# Patient Record
Sex: Female | Born: 1935 | Race: White | Hispanic: No | Marital: Married | Smoking: Never smoker
Health system: Southern US, Community
[De-identification: ages and names within clinical notes are randomized; demographics above are authoritative.]

## PROBLEM LIST (undated history)

## (undated) DIAGNOSIS — I10 Essential (primary) hypertension: Secondary | ICD-10-CM

## (undated) HISTORY — PX: ABDOMINAL HYSTERECTOMY: SHX81

## (undated) HISTORY — PX: BREAST LUMPECTOMY: SHX2

## (undated) HISTORY — PX: CHOLECYSTECTOMY: SHX55

---

## 2014-07-16 DIAGNOSIS — H524 Presbyopia: Secondary | ICD-10-CM | POA: Diagnosis not present

## 2014-07-16 DIAGNOSIS — H25813 Combined forms of age-related cataract, bilateral: Secondary | ICD-10-CM | POA: Diagnosis not present

## 2014-07-18 DIAGNOSIS — R6889 Other general symptoms and signs: Secondary | ICD-10-CM | POA: Diagnosis not present

## 2014-09-14 DIAGNOSIS — Z79899 Other long term (current) drug therapy: Secondary | ICD-10-CM | POA: Diagnosis not present

## 2014-09-14 DIAGNOSIS — Z0000 Encounter for general adult medical examination without abnormal findings: Secondary | ICD-10-CM | POA: Diagnosis not present

## 2014-09-14 DIAGNOSIS — E538 Deficiency of other specified B group vitamins: Secondary | ICD-10-CM | POA: Diagnosis not present

## 2014-09-14 DIAGNOSIS — E780 Pure hypercholesterolemia: Secondary | ICD-10-CM | POA: Diagnosis not present

## 2014-09-14 DIAGNOSIS — E539 Vitamin B deficiency, unspecified: Secondary | ICD-10-CM | POA: Diagnosis not present

## 2014-10-07 DIAGNOSIS — K5730 Diverticulosis of large intestine without perforation or abscess without bleeding: Secondary | ICD-10-CM | POA: Diagnosis not present

## 2014-10-12 DIAGNOSIS — E539 Vitamin B deficiency, unspecified: Secondary | ICD-10-CM | POA: Diagnosis not present

## 2014-10-12 DIAGNOSIS — I10 Essential (primary) hypertension: Secondary | ICD-10-CM | POA: Diagnosis not present

## 2014-11-03 DIAGNOSIS — K5730 Diverticulosis of large intestine without perforation or abscess without bleeding: Secondary | ICD-10-CM | POA: Diagnosis not present

## 2014-11-03 DIAGNOSIS — Z9049 Acquired absence of other specified parts of digestive tract: Secondary | ICD-10-CM | POA: Diagnosis not present

## 2014-11-03 DIAGNOSIS — K648 Other hemorrhoids: Secondary | ICD-10-CM | POA: Diagnosis not present

## 2014-11-03 DIAGNOSIS — I10 Essential (primary) hypertension: Secondary | ICD-10-CM | POA: Diagnosis not present

## 2014-11-03 DIAGNOSIS — Z86010 Personal history of colonic polyps: Secondary | ICD-10-CM | POA: Diagnosis not present

## 2014-11-03 DIAGNOSIS — Z1211 Encounter for screening for malignant neoplasm of colon: Secondary | ICD-10-CM | POA: Diagnosis not present

## 2014-11-05 DIAGNOSIS — J209 Acute bronchitis, unspecified: Secondary | ICD-10-CM | POA: Diagnosis not present

## 2014-12-07 DIAGNOSIS — M25551 Pain in right hip: Secondary | ICD-10-CM | POA: Diagnosis not present

## 2014-12-19 DIAGNOSIS — M7071 Other bursitis of hip, right hip: Secondary | ICD-10-CM | POA: Diagnosis not present

## 2015-01-11 DIAGNOSIS — M7071 Other bursitis of hip, right hip: Secondary | ICD-10-CM | POA: Diagnosis not present

## 2015-02-04 DIAGNOSIS — Z1389 Encounter for screening for other disorder: Secondary | ICD-10-CM | POA: Diagnosis not present

## 2015-02-04 DIAGNOSIS — E780 Pure hypercholesterolemia: Secondary | ICD-10-CM | POA: Diagnosis not present

## 2015-02-04 DIAGNOSIS — Z0000 Encounter for general adult medical examination without abnormal findings: Secondary | ICD-10-CM | POA: Diagnosis not present

## 2015-02-04 DIAGNOSIS — H25813 Combined forms of age-related cataract, bilateral: Secondary | ICD-10-CM | POA: Diagnosis not present

## 2015-02-04 DIAGNOSIS — Z79899 Other long term (current) drug therapy: Secondary | ICD-10-CM | POA: Diagnosis not present

## 2015-02-04 DIAGNOSIS — Z9181 History of falling: Secondary | ICD-10-CM | POA: Diagnosis not present

## 2015-02-25 DIAGNOSIS — M7071 Other bursitis of hip, right hip: Secondary | ICD-10-CM | POA: Diagnosis not present

## 2015-03-03 DIAGNOSIS — M6281 Muscle weakness (generalized): Secondary | ICD-10-CM | POA: Diagnosis not present

## 2015-03-03 DIAGNOSIS — M2560 Stiffness of unspecified joint, not elsewhere classified: Secondary | ICD-10-CM | POA: Diagnosis not present

## 2015-03-03 DIAGNOSIS — M7071 Other bursitis of hip, right hip: Secondary | ICD-10-CM | POA: Diagnosis not present

## 2015-03-03 DIAGNOSIS — M25652 Stiffness of left hip, not elsewhere classified: Secondary | ICD-10-CM | POA: Diagnosis not present

## 2015-03-03 DIAGNOSIS — M79651 Pain in right thigh: Secondary | ICD-10-CM | POA: Diagnosis not present

## 2015-03-03 DIAGNOSIS — M25551 Pain in right hip: Secondary | ICD-10-CM | POA: Diagnosis not present

## 2015-03-03 DIAGNOSIS — R293 Abnormal posture: Secondary | ICD-10-CM | POA: Diagnosis not present

## 2015-03-03 DIAGNOSIS — M25651 Stiffness of right hip, not elsewhere classified: Secondary | ICD-10-CM | POA: Diagnosis not present

## 2015-03-10 DIAGNOSIS — Z23 Encounter for immunization: Secondary | ICD-10-CM | POA: Diagnosis not present

## 2015-03-24 DIAGNOSIS — Z1231 Encounter for screening mammogram for malignant neoplasm of breast: Secondary | ICD-10-CM | POA: Diagnosis not present

## 2015-04-04 DIAGNOSIS — M25551 Pain in right hip: Secondary | ICD-10-CM | POA: Diagnosis not present

## 2015-04-06 DIAGNOSIS — M7061 Trochanteric bursitis, right hip: Secondary | ICD-10-CM | POA: Diagnosis not present

## 2015-04-15 DIAGNOSIS — R2689 Other abnormalities of gait and mobility: Secondary | ICD-10-CM | POA: Diagnosis not present

## 2015-04-15 DIAGNOSIS — M25551 Pain in right hip: Secondary | ICD-10-CM | POA: Diagnosis not present

## 2015-04-15 DIAGNOSIS — M6281 Muscle weakness (generalized): Secondary | ICD-10-CM | POA: Diagnosis not present

## 2015-04-15 DIAGNOSIS — M7061 Trochanteric bursitis, right hip: Secondary | ICD-10-CM | POA: Diagnosis not present

## 2015-04-15 DIAGNOSIS — M25651 Stiffness of right hip, not elsewhere classified: Secondary | ICD-10-CM | POA: Diagnosis not present

## 2015-04-20 DIAGNOSIS — R2689 Other abnormalities of gait and mobility: Secondary | ICD-10-CM | POA: Diagnosis not present

## 2015-04-20 DIAGNOSIS — M6281 Muscle weakness (generalized): Secondary | ICD-10-CM | POA: Diagnosis not present

## 2015-04-20 DIAGNOSIS — M7061 Trochanteric bursitis, right hip: Secondary | ICD-10-CM | POA: Diagnosis not present

## 2015-04-20 DIAGNOSIS — M25651 Stiffness of right hip, not elsewhere classified: Secondary | ICD-10-CM | POA: Diagnosis not present

## 2015-04-20 DIAGNOSIS — M25551 Pain in right hip: Secondary | ICD-10-CM | POA: Diagnosis not present

## 2015-04-22 DIAGNOSIS — M25551 Pain in right hip: Secondary | ICD-10-CM | POA: Diagnosis not present

## 2015-04-22 DIAGNOSIS — M6281 Muscle weakness (generalized): Secondary | ICD-10-CM | POA: Diagnosis not present

## 2015-04-22 DIAGNOSIS — M7061 Trochanteric bursitis, right hip: Secondary | ICD-10-CM | POA: Diagnosis not present

## 2015-04-22 DIAGNOSIS — M25651 Stiffness of right hip, not elsewhere classified: Secondary | ICD-10-CM | POA: Diagnosis not present

## 2015-04-22 DIAGNOSIS — R2689 Other abnormalities of gait and mobility: Secondary | ICD-10-CM | POA: Diagnosis not present

## 2015-04-28 DIAGNOSIS — M6281 Muscle weakness (generalized): Secondary | ICD-10-CM | POA: Diagnosis not present

## 2015-04-28 DIAGNOSIS — M7061 Trochanteric bursitis, right hip: Secondary | ICD-10-CM | POA: Diagnosis not present

## 2015-04-28 DIAGNOSIS — M25551 Pain in right hip: Secondary | ICD-10-CM | POA: Diagnosis not present

## 2015-04-28 DIAGNOSIS — M25651 Stiffness of right hip, not elsewhere classified: Secondary | ICD-10-CM | POA: Diagnosis not present

## 2015-04-28 DIAGNOSIS — R2689 Other abnormalities of gait and mobility: Secondary | ICD-10-CM | POA: Diagnosis not present

## 2015-04-29 DIAGNOSIS — M25651 Stiffness of right hip, not elsewhere classified: Secondary | ICD-10-CM | POA: Diagnosis not present

## 2015-04-29 DIAGNOSIS — M6281 Muscle weakness (generalized): Secondary | ICD-10-CM | POA: Diagnosis not present

## 2015-04-29 DIAGNOSIS — R2689 Other abnormalities of gait and mobility: Secondary | ICD-10-CM | POA: Diagnosis not present

## 2015-04-29 DIAGNOSIS — M25551 Pain in right hip: Secondary | ICD-10-CM | POA: Diagnosis not present

## 2015-04-29 DIAGNOSIS — M7061 Trochanteric bursitis, right hip: Secondary | ICD-10-CM | POA: Diagnosis not present

## 2015-05-08 DIAGNOSIS — K529 Noninfective gastroenteritis and colitis, unspecified: Secondary | ICD-10-CM | POA: Diagnosis not present

## 2015-05-13 DIAGNOSIS — M7061 Trochanteric bursitis, right hip: Secondary | ICD-10-CM | POA: Diagnosis not present

## 2015-06-10 DIAGNOSIS — D1720 Benign lipomatous neoplasm of skin and subcutaneous tissue of unspecified limb: Secondary | ICD-10-CM | POA: Diagnosis not present

## 2016-06-23 DIAGNOSIS — M9903 Segmental and somatic dysfunction of lumbar region: Secondary | ICD-10-CM | POA: Diagnosis not present

## 2016-06-23 DIAGNOSIS — M4306 Spondylolysis, lumbar region: Secondary | ICD-10-CM | POA: Diagnosis not present

## 2016-06-23 DIAGNOSIS — M5431 Sciatica, right side: Secondary | ICD-10-CM | POA: Diagnosis not present

## 2016-06-23 DIAGNOSIS — M9904 Segmental and somatic dysfunction of sacral region: Secondary | ICD-10-CM | POA: Diagnosis not present

## 2016-06-23 DIAGNOSIS — M4307 Spondylolysis, lumbosacral region: Secondary | ICD-10-CM | POA: Diagnosis not present

## 2016-06-23 DIAGNOSIS — M9905 Segmental and somatic dysfunction of pelvic region: Secondary | ICD-10-CM | POA: Diagnosis not present

## 2016-07-12 DIAGNOSIS — H524 Presbyopia: Secondary | ICD-10-CM | POA: Diagnosis not present

## 2016-07-15 DIAGNOSIS — M9904 Segmental and somatic dysfunction of sacral region: Secondary | ICD-10-CM | POA: Diagnosis not present

## 2016-07-15 DIAGNOSIS — M5431 Sciatica, right side: Secondary | ICD-10-CM | POA: Diagnosis not present

## 2016-07-15 DIAGNOSIS — M4316 Spondylolisthesis, lumbar region: Secondary | ICD-10-CM | POA: Diagnosis not present

## 2016-07-15 DIAGNOSIS — M9905 Segmental and somatic dysfunction of pelvic region: Secondary | ICD-10-CM | POA: Diagnosis not present

## 2016-07-15 DIAGNOSIS — M9903 Segmental and somatic dysfunction of lumbar region: Secondary | ICD-10-CM | POA: Diagnosis not present

## 2016-07-15 DIAGNOSIS — M4307 Spondylolysis, lumbosacral region: Secondary | ICD-10-CM | POA: Diagnosis not present

## 2016-07-29 DIAGNOSIS — Z9849 Cataract extraction status, unspecified eye: Secondary | ICD-10-CM | POA: Diagnosis not present

## 2016-07-29 DIAGNOSIS — J209 Acute bronchitis, unspecified: Secondary | ICD-10-CM | POA: Diagnosis not present

## 2016-08-04 DIAGNOSIS — M9905 Segmental and somatic dysfunction of pelvic region: Secondary | ICD-10-CM | POA: Diagnosis not present

## 2016-08-04 DIAGNOSIS — M9904 Segmental and somatic dysfunction of sacral region: Secondary | ICD-10-CM | POA: Diagnosis not present

## 2016-08-04 DIAGNOSIS — M4307 Spondylolysis, lumbosacral region: Secondary | ICD-10-CM | POA: Diagnosis not present

## 2016-08-04 DIAGNOSIS — M4316 Spondylolisthesis, lumbar region: Secondary | ICD-10-CM | POA: Diagnosis not present

## 2016-08-04 DIAGNOSIS — M9903 Segmental and somatic dysfunction of lumbar region: Secondary | ICD-10-CM | POA: Diagnosis not present

## 2016-08-04 DIAGNOSIS — M5431 Sciatica, right side: Secondary | ICD-10-CM | POA: Diagnosis not present

## 2016-08-09 DIAGNOSIS — T7840XA Allergy, unspecified, initial encounter: Secondary | ICD-10-CM | POA: Diagnosis not present

## 2016-08-18 DIAGNOSIS — M5418 Radiculopathy, sacral and sacrococcygeal region: Secondary | ICD-10-CM | POA: Diagnosis not present

## 2016-08-18 DIAGNOSIS — M5388 Other specified dorsopathies, sacral and sacrococcygeal region: Secondary | ICD-10-CM | POA: Diagnosis not present

## 2016-08-18 DIAGNOSIS — M9903 Segmental and somatic dysfunction of lumbar region: Secondary | ICD-10-CM | POA: Diagnosis not present

## 2016-08-18 DIAGNOSIS — M9905 Segmental and somatic dysfunction of pelvic region: Secondary | ICD-10-CM | POA: Diagnosis not present

## 2016-08-18 DIAGNOSIS — M9904 Segmental and somatic dysfunction of sacral region: Secondary | ICD-10-CM | POA: Diagnosis not present

## 2016-08-18 DIAGNOSIS — M4316 Spondylolisthesis, lumbar region: Secondary | ICD-10-CM | POA: Diagnosis not present

## 2016-08-24 DIAGNOSIS — M4316 Spondylolisthesis, lumbar region: Secondary | ICD-10-CM | POA: Diagnosis not present

## 2016-08-24 DIAGNOSIS — M5418 Radiculopathy, sacral and sacrococcygeal region: Secondary | ICD-10-CM | POA: Diagnosis not present

## 2016-08-24 DIAGNOSIS — M5388 Other specified dorsopathies, sacral and sacrococcygeal region: Secondary | ICD-10-CM | POA: Diagnosis not present

## 2016-08-24 DIAGNOSIS — M9905 Segmental and somatic dysfunction of pelvic region: Secondary | ICD-10-CM | POA: Diagnosis not present

## 2016-08-24 DIAGNOSIS — M9904 Segmental and somatic dysfunction of sacral region: Secondary | ICD-10-CM | POA: Diagnosis not present

## 2016-08-24 DIAGNOSIS — M9903 Segmental and somatic dysfunction of lumbar region: Secondary | ICD-10-CM | POA: Diagnosis not present

## 2016-09-02 DIAGNOSIS — M9904 Segmental and somatic dysfunction of sacral region: Secondary | ICD-10-CM | POA: Diagnosis not present

## 2016-09-02 DIAGNOSIS — M4316 Spondylolisthesis, lumbar region: Secondary | ICD-10-CM | POA: Diagnosis not present

## 2016-09-02 DIAGNOSIS — M9905 Segmental and somatic dysfunction of pelvic region: Secondary | ICD-10-CM | POA: Diagnosis not present

## 2016-09-02 DIAGNOSIS — M5388 Other specified dorsopathies, sacral and sacrococcygeal region: Secondary | ICD-10-CM | POA: Diagnosis not present

## 2016-09-02 DIAGNOSIS — M9903 Segmental and somatic dysfunction of lumbar region: Secondary | ICD-10-CM | POA: Diagnosis not present

## 2016-09-02 DIAGNOSIS — M5418 Radiculopathy, sacral and sacrococcygeal region: Secondary | ICD-10-CM | POA: Diagnosis not present

## 2016-09-06 DIAGNOSIS — M5418 Radiculopathy, sacral and sacrococcygeal region: Secondary | ICD-10-CM | POA: Diagnosis not present

## 2016-09-06 DIAGNOSIS — M5388 Other specified dorsopathies, sacral and sacrococcygeal region: Secondary | ICD-10-CM | POA: Diagnosis not present

## 2016-09-06 DIAGNOSIS — M4316 Spondylolisthesis, lumbar region: Secondary | ICD-10-CM | POA: Diagnosis not present

## 2016-09-06 DIAGNOSIS — M9905 Segmental and somatic dysfunction of pelvic region: Secondary | ICD-10-CM | POA: Diagnosis not present

## 2016-09-06 DIAGNOSIS — M9904 Segmental and somatic dysfunction of sacral region: Secondary | ICD-10-CM | POA: Diagnosis not present

## 2016-09-06 DIAGNOSIS — M9903 Segmental and somatic dysfunction of lumbar region: Secondary | ICD-10-CM | POA: Diagnosis not present

## 2016-09-15 DIAGNOSIS — M9904 Segmental and somatic dysfunction of sacral region: Secondary | ICD-10-CM | POA: Diagnosis not present

## 2016-09-15 DIAGNOSIS — M9905 Segmental and somatic dysfunction of pelvic region: Secondary | ICD-10-CM | POA: Diagnosis not present

## 2016-09-15 DIAGNOSIS — M9903 Segmental and somatic dysfunction of lumbar region: Secondary | ICD-10-CM | POA: Diagnosis not present

## 2016-09-15 DIAGNOSIS — M4607 Spinal enthesopathy, lumbosacral region: Secondary | ICD-10-CM | POA: Diagnosis not present

## 2016-09-15 DIAGNOSIS — M5388 Other specified dorsopathies, sacral and sacrococcygeal region: Secondary | ICD-10-CM | POA: Diagnosis not present

## 2016-09-15 DIAGNOSIS — M4307 Spondylolysis, lumbosacral region: Secondary | ICD-10-CM | POA: Diagnosis not present

## 2016-09-25 DIAGNOSIS — M4607 Spinal enthesopathy, lumbosacral region: Secondary | ICD-10-CM | POA: Diagnosis not present

## 2016-09-25 DIAGNOSIS — M4307 Spondylolysis, lumbosacral region: Secondary | ICD-10-CM | POA: Diagnosis not present

## 2016-09-25 DIAGNOSIS — M5388 Other specified dorsopathies, sacral and sacrococcygeal region: Secondary | ICD-10-CM | POA: Diagnosis not present

## 2016-09-25 DIAGNOSIS — M9904 Segmental and somatic dysfunction of sacral region: Secondary | ICD-10-CM | POA: Diagnosis not present

## 2016-09-25 DIAGNOSIS — M9905 Segmental and somatic dysfunction of pelvic region: Secondary | ICD-10-CM | POA: Diagnosis not present

## 2016-09-25 DIAGNOSIS — M9903 Segmental and somatic dysfunction of lumbar region: Secondary | ICD-10-CM | POA: Diagnosis not present

## 2016-09-27 DIAGNOSIS — H401131 Primary open-angle glaucoma, bilateral, mild stage: Secondary | ICD-10-CM | POA: Diagnosis not present

## 2016-10-03 DIAGNOSIS — M5431 Sciatica, right side: Secondary | ICD-10-CM | POA: Diagnosis not present

## 2016-10-03 DIAGNOSIS — M9905 Segmental and somatic dysfunction of pelvic region: Secondary | ICD-10-CM | POA: Diagnosis not present

## 2016-10-03 DIAGNOSIS — M4307 Spondylolysis, lumbosacral region: Secondary | ICD-10-CM | POA: Diagnosis not present

## 2016-10-03 DIAGNOSIS — M9903 Segmental and somatic dysfunction of lumbar region: Secondary | ICD-10-CM | POA: Diagnosis not present

## 2016-10-03 DIAGNOSIS — M9904 Segmental and somatic dysfunction of sacral region: Secondary | ICD-10-CM | POA: Diagnosis not present

## 2016-10-03 DIAGNOSIS — M5417 Radiculopathy, lumbosacral region: Secondary | ICD-10-CM | POA: Diagnosis not present

## 2016-10-12 DIAGNOSIS — M5417 Radiculopathy, lumbosacral region: Secondary | ICD-10-CM | POA: Diagnosis not present

## 2016-10-12 DIAGNOSIS — M9904 Segmental and somatic dysfunction of sacral region: Secondary | ICD-10-CM | POA: Diagnosis not present

## 2016-10-12 DIAGNOSIS — M5431 Sciatica, right side: Secondary | ICD-10-CM | POA: Diagnosis not present

## 2016-10-12 DIAGNOSIS — M9903 Segmental and somatic dysfunction of lumbar region: Secondary | ICD-10-CM | POA: Diagnosis not present

## 2016-10-12 DIAGNOSIS — M9905 Segmental and somatic dysfunction of pelvic region: Secondary | ICD-10-CM | POA: Diagnosis not present

## 2016-10-12 DIAGNOSIS — M4307 Spondylolysis, lumbosacral region: Secondary | ICD-10-CM | POA: Diagnosis not present

## 2016-10-17 DIAGNOSIS — M9905 Segmental and somatic dysfunction of pelvic region: Secondary | ICD-10-CM | POA: Diagnosis not present

## 2016-10-17 DIAGNOSIS — M5417 Radiculopathy, lumbosacral region: Secondary | ICD-10-CM | POA: Diagnosis not present

## 2016-10-17 DIAGNOSIS — M9903 Segmental and somatic dysfunction of lumbar region: Secondary | ICD-10-CM | POA: Diagnosis not present

## 2016-10-17 DIAGNOSIS — M9904 Segmental and somatic dysfunction of sacral region: Secondary | ICD-10-CM | POA: Diagnosis not present

## 2016-10-17 DIAGNOSIS — M4307 Spondylolysis, lumbosacral region: Secondary | ICD-10-CM | POA: Diagnosis not present

## 2016-10-17 DIAGNOSIS — M5431 Sciatica, right side: Secondary | ICD-10-CM | POA: Diagnosis not present

## 2016-10-21 DIAGNOSIS — L299 Pruritus, unspecified: Secondary | ICD-10-CM | POA: Diagnosis not present

## 2016-10-21 DIAGNOSIS — L820 Inflamed seborrheic keratosis: Secondary | ICD-10-CM | POA: Diagnosis not present

## 2016-10-21 DIAGNOSIS — L219 Seborrheic dermatitis, unspecified: Secondary | ICD-10-CM | POA: Diagnosis not present

## 2016-10-30 DIAGNOSIS — M9904 Segmental and somatic dysfunction of sacral region: Secondary | ICD-10-CM | POA: Diagnosis not present

## 2016-10-30 DIAGNOSIS — M4307 Spondylolysis, lumbosacral region: Secondary | ICD-10-CM | POA: Diagnosis not present

## 2016-10-30 DIAGNOSIS — M5431 Sciatica, right side: Secondary | ICD-10-CM | POA: Diagnosis not present

## 2016-10-30 DIAGNOSIS — M9903 Segmental and somatic dysfunction of lumbar region: Secondary | ICD-10-CM | POA: Diagnosis not present

## 2016-10-30 DIAGNOSIS — M9905 Segmental and somatic dysfunction of pelvic region: Secondary | ICD-10-CM | POA: Diagnosis not present

## 2016-10-30 DIAGNOSIS — M5417 Radiculopathy, lumbosacral region: Secondary | ICD-10-CM | POA: Diagnosis not present

## 2016-11-14 DIAGNOSIS — M4727 Other spondylosis with radiculopathy, lumbosacral region: Secondary | ICD-10-CM | POA: Diagnosis not present

## 2016-11-14 DIAGNOSIS — M9904 Segmental and somatic dysfunction of sacral region: Secondary | ICD-10-CM | POA: Diagnosis not present

## 2016-11-14 DIAGNOSIS — M9905 Segmental and somatic dysfunction of pelvic region: Secondary | ICD-10-CM | POA: Diagnosis not present

## 2016-11-14 DIAGNOSIS — M5431 Sciatica, right side: Secondary | ICD-10-CM | POA: Diagnosis not present

## 2016-11-14 DIAGNOSIS — M9903 Segmental and somatic dysfunction of lumbar region: Secondary | ICD-10-CM | POA: Diagnosis not present

## 2016-11-14 DIAGNOSIS — M4306 Spondylolysis, lumbar region: Secondary | ICD-10-CM | POA: Diagnosis not present

## 2016-11-28 DIAGNOSIS — M4306 Spondylolysis, lumbar region: Secondary | ICD-10-CM | POA: Diagnosis not present

## 2016-11-28 DIAGNOSIS — M9905 Segmental and somatic dysfunction of pelvic region: Secondary | ICD-10-CM | POA: Diagnosis not present

## 2016-11-28 DIAGNOSIS — M4727 Other spondylosis with radiculopathy, lumbosacral region: Secondary | ICD-10-CM | POA: Diagnosis not present

## 2016-11-28 DIAGNOSIS — M5431 Sciatica, right side: Secondary | ICD-10-CM | POA: Diagnosis not present

## 2016-11-28 DIAGNOSIS — M9903 Segmental and somatic dysfunction of lumbar region: Secondary | ICD-10-CM | POA: Diagnosis not present

## 2016-11-28 DIAGNOSIS — M9904 Segmental and somatic dysfunction of sacral region: Secondary | ICD-10-CM | POA: Diagnosis not present

## 2016-12-28 DIAGNOSIS — H401131 Primary open-angle glaucoma, bilateral, mild stage: Secondary | ICD-10-CM | POA: Diagnosis not present

## 2017-01-03 DIAGNOSIS — M9903 Segmental and somatic dysfunction of lumbar region: Secondary | ICD-10-CM | POA: Diagnosis not present

## 2017-01-03 DIAGNOSIS — M4307 Spondylolysis, lumbosacral region: Secondary | ICD-10-CM | POA: Diagnosis not present

## 2017-01-03 DIAGNOSIS — M9905 Segmental and somatic dysfunction of pelvic region: Secondary | ICD-10-CM | POA: Diagnosis not present

## 2017-01-03 DIAGNOSIS — M5388 Other specified dorsopathies, sacral and sacrococcygeal region: Secondary | ICD-10-CM | POA: Diagnosis not present

## 2017-01-03 DIAGNOSIS — M5432 Sciatica, left side: Secondary | ICD-10-CM | POA: Diagnosis not present

## 2017-01-03 DIAGNOSIS — M9904 Segmental and somatic dysfunction of sacral region: Secondary | ICD-10-CM | POA: Diagnosis not present

## 2017-01-25 DIAGNOSIS — M9905 Segmental and somatic dysfunction of pelvic region: Secondary | ICD-10-CM | POA: Diagnosis not present

## 2017-01-25 DIAGNOSIS — M9903 Segmental and somatic dysfunction of lumbar region: Secondary | ICD-10-CM | POA: Diagnosis not present

## 2017-01-25 DIAGNOSIS — M5431 Sciatica, right side: Secondary | ICD-10-CM | POA: Diagnosis not present

## 2017-01-25 DIAGNOSIS — M4307 Spondylolysis, lumbosacral region: Secondary | ICD-10-CM | POA: Diagnosis not present

## 2017-01-25 DIAGNOSIS — M9904 Segmental and somatic dysfunction of sacral region: Secondary | ICD-10-CM | POA: Diagnosis not present

## 2017-01-25 DIAGNOSIS — M5417 Radiculopathy, lumbosacral region: Secondary | ICD-10-CM | POA: Diagnosis not present

## 2017-02-14 DIAGNOSIS — M5388 Other specified dorsopathies, sacral and sacrococcygeal region: Secondary | ICD-10-CM | POA: Diagnosis not present

## 2017-02-14 DIAGNOSIS — M9903 Segmental and somatic dysfunction of lumbar region: Secondary | ICD-10-CM | POA: Diagnosis not present

## 2017-02-14 DIAGNOSIS — M4307 Spondylolysis, lumbosacral region: Secondary | ICD-10-CM | POA: Diagnosis not present

## 2017-02-14 DIAGNOSIS — M5431 Sciatica, right side: Secondary | ICD-10-CM | POA: Diagnosis not present

## 2017-02-14 DIAGNOSIS — M9904 Segmental and somatic dysfunction of sacral region: Secondary | ICD-10-CM | POA: Diagnosis not present

## 2017-02-14 DIAGNOSIS — M9905 Segmental and somatic dysfunction of pelvic region: Secondary | ICD-10-CM | POA: Diagnosis not present

## 2017-02-27 DIAGNOSIS — M5431 Sciatica, right side: Secondary | ICD-10-CM | POA: Diagnosis not present

## 2017-02-27 DIAGNOSIS — M9903 Segmental and somatic dysfunction of lumbar region: Secondary | ICD-10-CM | POA: Diagnosis not present

## 2017-02-27 DIAGNOSIS — M5388 Other specified dorsopathies, sacral and sacrococcygeal region: Secondary | ICD-10-CM | POA: Diagnosis not present

## 2017-02-27 DIAGNOSIS — M4307 Spondylolysis, lumbosacral region: Secondary | ICD-10-CM | POA: Diagnosis not present

## 2017-02-27 DIAGNOSIS — M9905 Segmental and somatic dysfunction of pelvic region: Secondary | ICD-10-CM | POA: Diagnosis not present

## 2017-02-27 DIAGNOSIS — M9904 Segmental and somatic dysfunction of sacral region: Secondary | ICD-10-CM | POA: Diagnosis not present

## 2017-03-01 DIAGNOSIS — M5388 Other specified dorsopathies, sacral and sacrococcygeal region: Secondary | ICD-10-CM | POA: Diagnosis not present

## 2017-03-01 DIAGNOSIS — M9905 Segmental and somatic dysfunction of pelvic region: Secondary | ICD-10-CM | POA: Diagnosis not present

## 2017-03-01 DIAGNOSIS — M5431 Sciatica, right side: Secondary | ICD-10-CM | POA: Diagnosis not present

## 2017-03-01 DIAGNOSIS — M9904 Segmental and somatic dysfunction of sacral region: Secondary | ICD-10-CM | POA: Diagnosis not present

## 2017-03-01 DIAGNOSIS — M4307 Spondylolysis, lumbosacral region: Secondary | ICD-10-CM | POA: Diagnosis not present

## 2017-03-01 DIAGNOSIS — M9903 Segmental and somatic dysfunction of lumbar region: Secondary | ICD-10-CM | POA: Diagnosis not present

## 2017-03-22 DIAGNOSIS — M9905 Segmental and somatic dysfunction of pelvic region: Secondary | ICD-10-CM | POA: Diagnosis not present

## 2017-03-22 DIAGNOSIS — M5431 Sciatica, right side: Secondary | ICD-10-CM | POA: Diagnosis not present

## 2017-03-22 DIAGNOSIS — M9903 Segmental and somatic dysfunction of lumbar region: Secondary | ICD-10-CM | POA: Diagnosis not present

## 2017-03-22 DIAGNOSIS — M5388 Other specified dorsopathies, sacral and sacrococcygeal region: Secondary | ICD-10-CM | POA: Diagnosis not present

## 2017-03-22 DIAGNOSIS — M4316 Spondylolisthesis, lumbar region: Secondary | ICD-10-CM | POA: Diagnosis not present

## 2017-03-22 DIAGNOSIS — M9904 Segmental and somatic dysfunction of sacral region: Secondary | ICD-10-CM | POA: Diagnosis not present

## 2017-04-05 DIAGNOSIS — M5431 Sciatica, right side: Secondary | ICD-10-CM | POA: Diagnosis not present

## 2017-04-05 DIAGNOSIS — M9903 Segmental and somatic dysfunction of lumbar region: Secondary | ICD-10-CM | POA: Diagnosis not present

## 2017-04-05 DIAGNOSIS — M9904 Segmental and somatic dysfunction of sacral region: Secondary | ICD-10-CM | POA: Diagnosis not present

## 2017-04-05 DIAGNOSIS — M4316 Spondylolisthesis, lumbar region: Secondary | ICD-10-CM | POA: Diagnosis not present

## 2017-04-05 DIAGNOSIS — M9905 Segmental and somatic dysfunction of pelvic region: Secondary | ICD-10-CM | POA: Diagnosis not present

## 2017-04-05 DIAGNOSIS — M5388 Other specified dorsopathies, sacral and sacrococcygeal region: Secondary | ICD-10-CM | POA: Diagnosis not present

## 2017-04-11 DIAGNOSIS — K219 Gastro-esophageal reflux disease without esophagitis: Secondary | ICD-10-CM | POA: Diagnosis not present

## 2017-04-11 DIAGNOSIS — E539 Vitamin B deficiency, unspecified: Secondary | ICD-10-CM | POA: Diagnosis not present

## 2017-04-11 DIAGNOSIS — Z6824 Body mass index (BMI) 24.0-24.9, adult: Secondary | ICD-10-CM | POA: Diagnosis not present

## 2017-04-11 DIAGNOSIS — Z0000 Encounter for general adult medical examination without abnormal findings: Secondary | ICD-10-CM | POA: Diagnosis not present

## 2017-04-11 DIAGNOSIS — E785 Hyperlipidemia, unspecified: Secondary | ICD-10-CM | POA: Diagnosis not present

## 2017-04-11 DIAGNOSIS — Z79899 Other long term (current) drug therapy: Secondary | ICD-10-CM | POA: Diagnosis not present

## 2017-04-11 DIAGNOSIS — Z1331 Encounter for screening for depression: Secondary | ICD-10-CM | POA: Diagnosis not present

## 2017-04-11 DIAGNOSIS — I10 Essential (primary) hypertension: Secondary | ICD-10-CM | POA: Diagnosis not present

## 2017-04-11 DIAGNOSIS — Z9181 History of falling: Secondary | ICD-10-CM | POA: Diagnosis not present

## 2017-04-11 DIAGNOSIS — R739 Hyperglycemia, unspecified: Secondary | ICD-10-CM | POA: Diagnosis not present

## 2017-04-11 DIAGNOSIS — Z5320 Procedure and treatment not carried out because of patient's decision for unspecified reasons: Secondary | ICD-10-CM | POA: Diagnosis not present

## 2017-04-12 DIAGNOSIS — H401131 Primary open-angle glaucoma, bilateral, mild stage: Secondary | ICD-10-CM | POA: Diagnosis not present

## 2017-04-13 DIAGNOSIS — Z1231 Encounter for screening mammogram for malignant neoplasm of breast: Secondary | ICD-10-CM | POA: Diagnosis not present

## 2017-04-26 DIAGNOSIS — M9903 Segmental and somatic dysfunction of lumbar region: Secondary | ICD-10-CM | POA: Diagnosis not present

## 2017-04-26 DIAGNOSIS — M9904 Segmental and somatic dysfunction of sacral region: Secondary | ICD-10-CM | POA: Diagnosis not present

## 2017-04-26 DIAGNOSIS — M4316 Spondylolisthesis, lumbar region: Secondary | ICD-10-CM | POA: Diagnosis not present

## 2017-04-26 DIAGNOSIS — M5388 Other specified dorsopathies, sacral and sacrococcygeal region: Secondary | ICD-10-CM | POA: Diagnosis not present

## 2017-04-26 DIAGNOSIS — M5431 Sciatica, right side: Secondary | ICD-10-CM | POA: Diagnosis not present

## 2017-04-26 DIAGNOSIS — M9905 Segmental and somatic dysfunction of pelvic region: Secondary | ICD-10-CM | POA: Diagnosis not present

## 2017-05-16 DIAGNOSIS — M5431 Sciatica, right side: Secondary | ICD-10-CM | POA: Diagnosis not present

## 2017-05-16 DIAGNOSIS — M5388 Other specified dorsopathies, sacral and sacrococcygeal region: Secondary | ICD-10-CM | POA: Diagnosis not present

## 2017-05-16 DIAGNOSIS — M4316 Spondylolisthesis, lumbar region: Secondary | ICD-10-CM | POA: Diagnosis not present

## 2017-05-16 DIAGNOSIS — M9903 Segmental and somatic dysfunction of lumbar region: Secondary | ICD-10-CM | POA: Diagnosis not present

## 2017-05-16 DIAGNOSIS — M9905 Segmental and somatic dysfunction of pelvic region: Secondary | ICD-10-CM | POA: Diagnosis not present

## 2017-05-16 DIAGNOSIS — M9904 Segmental and somatic dysfunction of sacral region: Secondary | ICD-10-CM | POA: Diagnosis not present

## 2017-05-25 DIAGNOSIS — J069 Acute upper respiratory infection, unspecified: Secondary | ICD-10-CM | POA: Diagnosis not present

## 2017-05-25 DIAGNOSIS — Z6825 Body mass index (BMI) 25.0-25.9, adult: Secondary | ICD-10-CM | POA: Diagnosis not present

## 2017-06-11 DIAGNOSIS — M9905 Segmental and somatic dysfunction of pelvic region: Secondary | ICD-10-CM | POA: Diagnosis not present

## 2017-06-11 DIAGNOSIS — M9904 Segmental and somatic dysfunction of sacral region: Secondary | ICD-10-CM | POA: Diagnosis not present

## 2017-06-11 DIAGNOSIS — M5388 Other specified dorsopathies, sacral and sacrococcygeal region: Secondary | ICD-10-CM | POA: Diagnosis not present

## 2017-06-11 DIAGNOSIS — M9903 Segmental and somatic dysfunction of lumbar region: Secondary | ICD-10-CM | POA: Diagnosis not present

## 2017-06-11 DIAGNOSIS — M5431 Sciatica, right side: Secondary | ICD-10-CM | POA: Diagnosis not present

## 2017-06-11 DIAGNOSIS — M4316 Spondylolisthesis, lumbar region: Secondary | ICD-10-CM | POA: Diagnosis not present

## 2017-06-30 DIAGNOSIS — S61441A Puncture wound with foreign body of right hand, initial encounter: Secondary | ICD-10-CM | POA: Diagnosis not present

## 2017-06-30 DIAGNOSIS — S60459A Superficial foreign body of unspecified finger, initial encounter: Secondary | ICD-10-CM | POA: Diagnosis not present

## 2017-06-30 DIAGNOSIS — M795 Residual foreign body in soft tissue: Secondary | ICD-10-CM | POA: Diagnosis not present

## 2017-07-05 DIAGNOSIS — E539 Vitamin B deficiency, unspecified: Secondary | ICD-10-CM | POA: Diagnosis not present

## 2017-07-05 DIAGNOSIS — M9904 Segmental and somatic dysfunction of sacral region: Secondary | ICD-10-CM | POA: Diagnosis not present

## 2017-07-05 DIAGNOSIS — M5388 Other specified dorsopathies, sacral and sacrococcygeal region: Secondary | ICD-10-CM | POA: Diagnosis not present

## 2017-07-05 DIAGNOSIS — Z6825 Body mass index (BMI) 25.0-25.9, adult: Secondary | ICD-10-CM | POA: Diagnosis not present

## 2017-07-05 DIAGNOSIS — M4728 Other spondylosis with radiculopathy, sacral and sacrococcygeal region: Secondary | ICD-10-CM | POA: Diagnosis not present

## 2017-07-05 DIAGNOSIS — M4307 Spondylolysis, lumbosacral region: Secondary | ICD-10-CM | POA: Diagnosis not present

## 2017-07-05 DIAGNOSIS — S6991XD Unspecified injury of right wrist, hand and finger(s), subsequent encounter: Secondary | ICD-10-CM | POA: Diagnosis not present

## 2017-07-05 DIAGNOSIS — M9903 Segmental and somatic dysfunction of lumbar region: Secondary | ICD-10-CM | POA: Diagnosis not present

## 2017-07-05 DIAGNOSIS — M9905 Segmental and somatic dysfunction of pelvic region: Secondary | ICD-10-CM | POA: Diagnosis not present

## 2017-07-12 DIAGNOSIS — S41112A Laceration without foreign body of left upper arm, initial encounter: Secondary | ICD-10-CM | POA: Diagnosis not present

## 2017-07-18 DIAGNOSIS — H401131 Primary open-angle glaucoma, bilateral, mild stage: Secondary | ICD-10-CM | POA: Diagnosis not present

## 2017-07-26 DIAGNOSIS — M9905 Segmental and somatic dysfunction of pelvic region: Secondary | ICD-10-CM | POA: Diagnosis not present

## 2017-07-26 DIAGNOSIS — M5388 Other specified dorsopathies, sacral and sacrococcygeal region: Secondary | ICD-10-CM | POA: Diagnosis not present

## 2017-07-26 DIAGNOSIS — M4728 Other spondylosis with radiculopathy, sacral and sacrococcygeal region: Secondary | ICD-10-CM | POA: Diagnosis not present

## 2017-07-26 DIAGNOSIS — M9903 Segmental and somatic dysfunction of lumbar region: Secondary | ICD-10-CM | POA: Diagnosis not present

## 2017-07-26 DIAGNOSIS — M9904 Segmental and somatic dysfunction of sacral region: Secondary | ICD-10-CM | POA: Diagnosis not present

## 2017-07-26 DIAGNOSIS — M4307 Spondylolysis, lumbosacral region: Secondary | ICD-10-CM | POA: Diagnosis not present

## 2017-08-16 DIAGNOSIS — M4316 Spondylolisthesis, lumbar region: Secondary | ICD-10-CM | POA: Diagnosis not present

## 2017-08-16 DIAGNOSIS — M9904 Segmental and somatic dysfunction of sacral region: Secondary | ICD-10-CM | POA: Diagnosis not present

## 2017-08-16 DIAGNOSIS — M5388 Other specified dorsopathies, sacral and sacrococcygeal region: Secondary | ICD-10-CM | POA: Diagnosis not present

## 2017-08-16 DIAGNOSIS — M47818 Spondylosis without myelopathy or radiculopathy, sacral and sacrococcygeal region: Secondary | ICD-10-CM | POA: Diagnosis not present

## 2017-08-16 DIAGNOSIS — M9905 Segmental and somatic dysfunction of pelvic region: Secondary | ICD-10-CM | POA: Diagnosis not present

## 2017-08-16 DIAGNOSIS — M9903 Segmental and somatic dysfunction of lumbar region: Secondary | ICD-10-CM | POA: Diagnosis not present

## 2017-09-04 DIAGNOSIS — M9903 Segmental and somatic dysfunction of lumbar region: Secondary | ICD-10-CM | POA: Diagnosis not present

## 2017-09-04 DIAGNOSIS — M4316 Spondylolisthesis, lumbar region: Secondary | ICD-10-CM | POA: Diagnosis not present

## 2017-09-04 DIAGNOSIS — M9904 Segmental and somatic dysfunction of sacral region: Secondary | ICD-10-CM | POA: Diagnosis not present

## 2017-09-04 DIAGNOSIS — M5388 Other specified dorsopathies, sacral and sacrococcygeal region: Secondary | ICD-10-CM | POA: Diagnosis not present

## 2017-09-04 DIAGNOSIS — M9905 Segmental and somatic dysfunction of pelvic region: Secondary | ICD-10-CM | POA: Diagnosis not present

## 2017-09-04 DIAGNOSIS — M47818 Spondylosis without myelopathy or radiculopathy, sacral and sacrococcygeal region: Secondary | ICD-10-CM | POA: Diagnosis not present

## 2017-09-27 DIAGNOSIS — M5388 Other specified dorsopathies, sacral and sacrococcygeal region: Secondary | ICD-10-CM | POA: Diagnosis not present

## 2017-09-27 DIAGNOSIS — M5432 Sciatica, left side: Secondary | ICD-10-CM | POA: Diagnosis not present

## 2017-09-27 DIAGNOSIS — M4316 Spondylolisthesis, lumbar region: Secondary | ICD-10-CM | POA: Diagnosis not present

## 2017-09-27 DIAGNOSIS — M9905 Segmental and somatic dysfunction of pelvic region: Secondary | ICD-10-CM | POA: Diagnosis not present

## 2017-09-27 DIAGNOSIS — M9904 Segmental and somatic dysfunction of sacral region: Secondary | ICD-10-CM | POA: Diagnosis not present

## 2017-10-10 DIAGNOSIS — M9904 Segmental and somatic dysfunction of sacral region: Secondary | ICD-10-CM | POA: Diagnosis not present

## 2017-10-10 DIAGNOSIS — M5388 Other specified dorsopathies, sacral and sacrococcygeal region: Secondary | ICD-10-CM | POA: Diagnosis not present

## 2017-10-10 DIAGNOSIS — M5432 Sciatica, left side: Secondary | ICD-10-CM | POA: Diagnosis not present

## 2017-10-10 DIAGNOSIS — M9905 Segmental and somatic dysfunction of pelvic region: Secondary | ICD-10-CM | POA: Diagnosis not present

## 2017-10-10 DIAGNOSIS — M4316 Spondylolisthesis, lumbar region: Secondary | ICD-10-CM | POA: Diagnosis not present

## 2017-10-10 DIAGNOSIS — M9903 Segmental and somatic dysfunction of lumbar region: Secondary | ICD-10-CM | POA: Diagnosis not present

## 2017-10-17 DIAGNOSIS — H401131 Primary open-angle glaucoma, bilateral, mild stage: Secondary | ICD-10-CM | POA: Diagnosis not present

## 2017-10-25 DIAGNOSIS — L820 Inflamed seborrheic keratosis: Secondary | ICD-10-CM | POA: Diagnosis not present

## 2017-10-30 DIAGNOSIS — M9903 Segmental and somatic dysfunction of lumbar region: Secondary | ICD-10-CM | POA: Diagnosis not present

## 2017-10-30 DIAGNOSIS — M9905 Segmental and somatic dysfunction of pelvic region: Secondary | ICD-10-CM | POA: Diagnosis not present

## 2017-10-30 DIAGNOSIS — M5432 Sciatica, left side: Secondary | ICD-10-CM | POA: Diagnosis not present

## 2017-10-30 DIAGNOSIS — M9904 Segmental and somatic dysfunction of sacral region: Secondary | ICD-10-CM | POA: Diagnosis not present

## 2017-10-30 DIAGNOSIS — M5388 Other specified dorsopathies, sacral and sacrococcygeal region: Secondary | ICD-10-CM | POA: Diagnosis not present

## 2017-10-30 DIAGNOSIS — M4316 Spondylolisthesis, lumbar region: Secondary | ICD-10-CM | POA: Diagnosis not present

## 2017-11-22 DIAGNOSIS — M9905 Segmental and somatic dysfunction of pelvic region: Secondary | ICD-10-CM | POA: Diagnosis not present

## 2017-11-22 DIAGNOSIS — M5431 Sciatica, right side: Secondary | ICD-10-CM | POA: Diagnosis not present

## 2017-11-22 DIAGNOSIS — M9903 Segmental and somatic dysfunction of lumbar region: Secondary | ICD-10-CM | POA: Diagnosis not present

## 2017-11-22 DIAGNOSIS — M5388 Other specified dorsopathies, sacral and sacrococcygeal region: Secondary | ICD-10-CM | POA: Diagnosis not present

## 2017-11-22 DIAGNOSIS — M4307 Spondylolysis, lumbosacral region: Secondary | ICD-10-CM | POA: Diagnosis not present

## 2017-11-22 DIAGNOSIS — M9904 Segmental and somatic dysfunction of sacral region: Secondary | ICD-10-CM | POA: Diagnosis not present

## 2017-11-28 DIAGNOSIS — M5431 Sciatica, right side: Secondary | ICD-10-CM | POA: Diagnosis not present

## 2017-11-28 DIAGNOSIS — M9903 Segmental and somatic dysfunction of lumbar region: Secondary | ICD-10-CM | POA: Diagnosis not present

## 2017-11-28 DIAGNOSIS — M4307 Spondylolysis, lumbosacral region: Secondary | ICD-10-CM | POA: Diagnosis not present

## 2017-11-28 DIAGNOSIS — M5388 Other specified dorsopathies, sacral and sacrococcygeal region: Secondary | ICD-10-CM | POA: Diagnosis not present

## 2017-11-28 DIAGNOSIS — M9904 Segmental and somatic dysfunction of sacral region: Secondary | ICD-10-CM | POA: Diagnosis not present

## 2017-11-28 DIAGNOSIS — M9905 Segmental and somatic dysfunction of pelvic region: Secondary | ICD-10-CM | POA: Diagnosis not present

## 2017-12-11 DIAGNOSIS — M9902 Segmental and somatic dysfunction of thoracic region: Secondary | ICD-10-CM | POA: Diagnosis not present

## 2017-12-11 DIAGNOSIS — M9905 Segmental and somatic dysfunction of pelvic region: Secondary | ICD-10-CM | POA: Diagnosis not present

## 2017-12-11 DIAGNOSIS — Z130 Encounter for screening for diseases of the blood and blood-forming organs and certain disorders involving the immune mechanism: Secondary | ICD-10-CM | POA: Diagnosis not present

## 2017-12-11 DIAGNOSIS — M4307 Spondylolysis, lumbosacral region: Secondary | ICD-10-CM | POA: Diagnosis not present

## 2017-12-11 DIAGNOSIS — M9903 Segmental and somatic dysfunction of lumbar region: Secondary | ICD-10-CM | POA: Diagnosis not present

## 2017-12-11 DIAGNOSIS — Z0189 Encounter for other specified special examinations: Secondary | ICD-10-CM | POA: Diagnosis not present

## 2017-12-11 DIAGNOSIS — R5381 Other malaise: Secondary | ICD-10-CM | POA: Diagnosis not present

## 2017-12-11 DIAGNOSIS — M4305 Spondylolysis, thoracolumbar region: Secondary | ICD-10-CM | POA: Diagnosis not present

## 2017-12-11 DIAGNOSIS — A6920 Lyme disease, unspecified: Secondary | ICD-10-CM | POA: Diagnosis not present

## 2017-12-11 DIAGNOSIS — M25551 Pain in right hip: Secondary | ICD-10-CM | POA: Diagnosis not present

## 2017-12-11 DIAGNOSIS — M5431 Sciatica, right side: Secondary | ICD-10-CM | POA: Diagnosis not present

## 2017-12-25 DIAGNOSIS — M9905 Segmental and somatic dysfunction of pelvic region: Secondary | ICD-10-CM | POA: Diagnosis not present

## 2017-12-25 DIAGNOSIS — M5431 Sciatica, right side: Secondary | ICD-10-CM | POA: Diagnosis not present

## 2017-12-25 DIAGNOSIS — M9903 Segmental and somatic dysfunction of lumbar region: Secondary | ICD-10-CM | POA: Diagnosis not present

## 2017-12-25 DIAGNOSIS — M4305 Spondylolysis, thoracolumbar region: Secondary | ICD-10-CM | POA: Diagnosis not present

## 2017-12-25 DIAGNOSIS — M9902 Segmental and somatic dysfunction of thoracic region: Secondary | ICD-10-CM | POA: Diagnosis not present

## 2017-12-25 DIAGNOSIS — M4307 Spondylolysis, lumbosacral region: Secondary | ICD-10-CM | POA: Diagnosis not present

## 2018-01-16 DIAGNOSIS — H401131 Primary open-angle glaucoma, bilateral, mild stage: Secondary | ICD-10-CM | POA: Diagnosis not present

## 2018-01-17 DIAGNOSIS — M4316 Spondylolisthesis, lumbar region: Secondary | ICD-10-CM | POA: Diagnosis not present

## 2018-01-17 DIAGNOSIS — M9904 Segmental and somatic dysfunction of sacral region: Secondary | ICD-10-CM | POA: Diagnosis not present

## 2018-01-17 DIAGNOSIS — M9905 Segmental and somatic dysfunction of pelvic region: Secondary | ICD-10-CM | POA: Diagnosis not present

## 2018-01-17 DIAGNOSIS — M9903 Segmental and somatic dysfunction of lumbar region: Secondary | ICD-10-CM | POA: Diagnosis not present

## 2018-01-17 DIAGNOSIS — M5388 Other specified dorsopathies, sacral and sacrococcygeal region: Secondary | ICD-10-CM | POA: Diagnosis not present

## 2018-01-17 DIAGNOSIS — M5431 Sciatica, right side: Secondary | ICD-10-CM | POA: Diagnosis not present

## 2018-02-20 DIAGNOSIS — M9905 Segmental and somatic dysfunction of pelvic region: Secondary | ICD-10-CM | POA: Diagnosis not present

## 2018-02-20 DIAGNOSIS — M4307 Spondylolysis, lumbosacral region: Secondary | ICD-10-CM | POA: Diagnosis not present

## 2018-02-20 DIAGNOSIS — M5431 Sciatica, right side: Secondary | ICD-10-CM | POA: Diagnosis not present

## 2018-02-20 DIAGNOSIS — M9903 Segmental and somatic dysfunction of lumbar region: Secondary | ICD-10-CM | POA: Diagnosis not present

## 2018-02-20 DIAGNOSIS — M4304 Spondylolysis, thoracic region: Secondary | ICD-10-CM | POA: Diagnosis not present

## 2018-03-08 DIAGNOSIS — M9904 Segmental and somatic dysfunction of sacral region: Secondary | ICD-10-CM | POA: Diagnosis not present

## 2018-03-08 DIAGNOSIS — M4727 Other spondylosis with radiculopathy, lumbosacral region: Secondary | ICD-10-CM | POA: Diagnosis not present

## 2018-03-08 DIAGNOSIS — M9903 Segmental and somatic dysfunction of lumbar region: Secondary | ICD-10-CM | POA: Diagnosis not present

## 2018-03-08 DIAGNOSIS — M4724 Other spondylosis with radiculopathy, thoracic region: Secondary | ICD-10-CM | POA: Diagnosis not present

## 2018-03-08 DIAGNOSIS — M9902 Segmental and somatic dysfunction of thoracic region: Secondary | ICD-10-CM | POA: Diagnosis not present

## 2018-03-08 DIAGNOSIS — M5388 Other specified dorsopathies, sacral and sacrococcygeal region: Secondary | ICD-10-CM | POA: Diagnosis not present

## 2018-03-28 DIAGNOSIS — M9902 Segmental and somatic dysfunction of thoracic region: Secondary | ICD-10-CM | POA: Diagnosis not present

## 2018-03-28 DIAGNOSIS — M5388 Other specified dorsopathies, sacral and sacrococcygeal region: Secondary | ICD-10-CM | POA: Diagnosis not present

## 2018-03-28 DIAGNOSIS — M4724 Other spondylosis with radiculopathy, thoracic region: Secondary | ICD-10-CM | POA: Diagnosis not present

## 2018-03-28 DIAGNOSIS — M9904 Segmental and somatic dysfunction of sacral region: Secondary | ICD-10-CM | POA: Diagnosis not present

## 2018-03-28 DIAGNOSIS — M9903 Segmental and somatic dysfunction of lumbar region: Secondary | ICD-10-CM | POA: Diagnosis not present

## 2018-03-28 DIAGNOSIS — M4727 Other spondylosis with radiculopathy, lumbosacral region: Secondary | ICD-10-CM | POA: Diagnosis not present

## 2018-04-16 DIAGNOSIS — M9901 Segmental and somatic dysfunction of cervical region: Secondary | ICD-10-CM | POA: Diagnosis not present

## 2018-04-16 DIAGNOSIS — M9904 Segmental and somatic dysfunction of sacral region: Secondary | ICD-10-CM | POA: Diagnosis not present

## 2018-04-16 DIAGNOSIS — M4727 Other spondylosis with radiculopathy, lumbosacral region: Secondary | ICD-10-CM | POA: Diagnosis not present

## 2018-04-16 DIAGNOSIS — M9903 Segmental and somatic dysfunction of lumbar region: Secondary | ICD-10-CM | POA: Diagnosis not present

## 2018-04-16 DIAGNOSIS — M4726 Other spondylosis with radiculopathy, lumbar region: Secondary | ICD-10-CM | POA: Diagnosis not present

## 2018-04-16 DIAGNOSIS — M4723 Other spondylosis with radiculopathy, cervicothoracic region: Secondary | ICD-10-CM | POA: Diagnosis not present

## 2018-04-24 DIAGNOSIS — H401131 Primary open-angle glaucoma, bilateral, mild stage: Secondary | ICD-10-CM | POA: Diagnosis not present

## 2018-04-29 DIAGNOSIS — Z79899 Other long term (current) drug therapy: Secondary | ICD-10-CM | POA: Diagnosis not present

## 2018-04-29 DIAGNOSIS — K219 Gastro-esophageal reflux disease without esophagitis: Secondary | ICD-10-CM | POA: Diagnosis not present

## 2018-04-29 DIAGNOSIS — E539 Vitamin B deficiency, unspecified: Secondary | ICD-10-CM | POA: Diagnosis not present

## 2018-04-29 DIAGNOSIS — Z0000 Encounter for general adult medical examination without abnormal findings: Secondary | ICD-10-CM | POA: Diagnosis not present

## 2018-04-29 DIAGNOSIS — Z9181 History of falling: Secondary | ICD-10-CM | POA: Diagnosis not present

## 2018-04-29 DIAGNOSIS — Z23 Encounter for immunization: Secondary | ICD-10-CM | POA: Diagnosis not present

## 2018-04-29 DIAGNOSIS — R739 Hyperglycemia, unspecified: Secondary | ICD-10-CM | POA: Diagnosis not present

## 2018-04-29 DIAGNOSIS — Z1339 Encounter for screening examination for other mental health and behavioral disorders: Secondary | ICD-10-CM | POA: Diagnosis not present

## 2018-04-29 DIAGNOSIS — E785 Hyperlipidemia, unspecified: Secondary | ICD-10-CM | POA: Diagnosis not present

## 2018-04-29 DIAGNOSIS — M159 Polyosteoarthritis, unspecified: Secondary | ICD-10-CM | POA: Diagnosis not present

## 2018-04-29 DIAGNOSIS — Z6823 Body mass index (BMI) 23.0-23.9, adult: Secondary | ICD-10-CM | POA: Diagnosis not present

## 2018-04-29 DIAGNOSIS — I10 Essential (primary) hypertension: Secondary | ICD-10-CM | POA: Diagnosis not present

## 2018-05-14 DIAGNOSIS — M4723 Other spondylosis with radiculopathy, cervicothoracic region: Secondary | ICD-10-CM | POA: Diagnosis not present

## 2018-05-14 DIAGNOSIS — M4726 Other spondylosis with radiculopathy, lumbar region: Secondary | ICD-10-CM | POA: Diagnosis not present

## 2018-05-14 DIAGNOSIS — M9901 Segmental and somatic dysfunction of cervical region: Secondary | ICD-10-CM | POA: Diagnosis not present

## 2018-05-14 DIAGNOSIS — M9904 Segmental and somatic dysfunction of sacral region: Secondary | ICD-10-CM | POA: Diagnosis not present

## 2018-05-14 DIAGNOSIS — M9903 Segmental and somatic dysfunction of lumbar region: Secondary | ICD-10-CM | POA: Diagnosis not present

## 2018-05-14 DIAGNOSIS — M4727 Other spondylosis with radiculopathy, lumbosacral region: Secondary | ICD-10-CM | POA: Diagnosis not present

## 2018-06-19 DIAGNOSIS — M9903 Segmental and somatic dysfunction of lumbar region: Secondary | ICD-10-CM | POA: Diagnosis not present

## 2018-06-19 DIAGNOSIS — M5431 Sciatica, right side: Secondary | ICD-10-CM | POA: Diagnosis not present

## 2018-06-19 DIAGNOSIS — M4727 Other spondylosis with radiculopathy, lumbosacral region: Secondary | ICD-10-CM | POA: Diagnosis not present

## 2018-06-19 DIAGNOSIS — M5388 Other specified dorsopathies, sacral and sacrococcygeal region: Secondary | ICD-10-CM | POA: Diagnosis not present

## 2018-06-19 DIAGNOSIS — M9904 Segmental and somatic dysfunction of sacral region: Secondary | ICD-10-CM | POA: Diagnosis not present

## 2018-06-19 DIAGNOSIS — M9905 Segmental and somatic dysfunction of pelvic region: Secondary | ICD-10-CM | POA: Diagnosis not present

## 2018-07-05 DIAGNOSIS — Z1231 Encounter for screening mammogram for malignant neoplasm of breast: Secondary | ICD-10-CM | POA: Diagnosis not present

## 2018-07-29 DIAGNOSIS — M9905 Segmental and somatic dysfunction of pelvic region: Secondary | ICD-10-CM | POA: Diagnosis not present

## 2018-07-29 DIAGNOSIS — M9904 Segmental and somatic dysfunction of sacral region: Secondary | ICD-10-CM | POA: Diagnosis not present

## 2018-07-29 DIAGNOSIS — M4308 Spondylolysis, sacral and sacrococcygeal region: Secondary | ICD-10-CM | POA: Diagnosis not present

## 2018-07-29 DIAGNOSIS — M5388 Other specified dorsopathies, sacral and sacrococcygeal region: Secondary | ICD-10-CM | POA: Diagnosis not present

## 2018-07-29 DIAGNOSIS — M4307 Spondylolysis, lumbosacral region: Secondary | ICD-10-CM | POA: Diagnosis not present

## 2018-07-29 DIAGNOSIS — M9903 Segmental and somatic dysfunction of lumbar region: Secondary | ICD-10-CM | POA: Diagnosis not present

## 2018-07-31 DIAGNOSIS — H401131 Primary open-angle glaucoma, bilateral, mild stage: Secondary | ICD-10-CM | POA: Diagnosis not present

## 2018-08-09 DIAGNOSIS — M159 Polyosteoarthritis, unspecified: Secondary | ICD-10-CM | POA: Diagnosis not present

## 2018-08-09 DIAGNOSIS — M25541 Pain in joints of right hand: Secondary | ICD-10-CM | POA: Diagnosis not present

## 2018-08-09 DIAGNOSIS — Z6824 Body mass index (BMI) 24.0-24.9, adult: Secondary | ICD-10-CM | POA: Diagnosis not present

## 2018-08-21 DIAGNOSIS — M5388 Other specified dorsopathies, sacral and sacrococcygeal region: Secondary | ICD-10-CM | POA: Diagnosis not present

## 2018-08-21 DIAGNOSIS — M9904 Segmental and somatic dysfunction of sacral region: Secondary | ICD-10-CM | POA: Diagnosis not present

## 2018-08-21 DIAGNOSIS — M9903 Segmental and somatic dysfunction of lumbar region: Secondary | ICD-10-CM | POA: Diagnosis not present

## 2018-08-21 DIAGNOSIS — M9905 Segmental and somatic dysfunction of pelvic region: Secondary | ICD-10-CM | POA: Diagnosis not present

## 2018-08-21 DIAGNOSIS — M4308 Spondylolysis, sacral and sacrococcygeal region: Secondary | ICD-10-CM | POA: Diagnosis not present

## 2018-08-21 DIAGNOSIS — M4307 Spondylolysis, lumbosacral region: Secondary | ICD-10-CM | POA: Diagnosis not present

## 2018-09-10 DIAGNOSIS — M5432 Sciatica, left side: Secondary | ICD-10-CM | POA: Diagnosis not present

## 2018-09-10 DIAGNOSIS — M5388 Other specified dorsopathies, sacral and sacrococcygeal region: Secondary | ICD-10-CM | POA: Diagnosis not present

## 2018-09-10 DIAGNOSIS — M9905 Segmental and somatic dysfunction of pelvic region: Secondary | ICD-10-CM | POA: Diagnosis not present

## 2018-09-10 DIAGNOSIS — M9904 Segmental and somatic dysfunction of sacral region: Secondary | ICD-10-CM | POA: Diagnosis not present

## 2018-09-10 DIAGNOSIS — M9901 Segmental and somatic dysfunction of cervical region: Secondary | ICD-10-CM | POA: Diagnosis not present

## 2018-09-10 DIAGNOSIS — M4303 Spondylolysis, cervicothoracic region: Secondary | ICD-10-CM | POA: Diagnosis not present

## 2018-09-26 DIAGNOSIS — M5432 Sciatica, left side: Secondary | ICD-10-CM | POA: Diagnosis not present

## 2018-09-26 DIAGNOSIS — M9904 Segmental and somatic dysfunction of sacral region: Secondary | ICD-10-CM | POA: Diagnosis not present

## 2018-09-26 DIAGNOSIS — M5388 Other specified dorsopathies, sacral and sacrococcygeal region: Secondary | ICD-10-CM | POA: Diagnosis not present

## 2018-09-26 DIAGNOSIS — M9901 Segmental and somatic dysfunction of cervical region: Secondary | ICD-10-CM | POA: Diagnosis not present

## 2018-09-26 DIAGNOSIS — M4303 Spondylolysis, cervicothoracic region: Secondary | ICD-10-CM | POA: Diagnosis not present

## 2018-09-26 DIAGNOSIS — M9905 Segmental and somatic dysfunction of pelvic region: Secondary | ICD-10-CM | POA: Diagnosis not present

## 2018-10-17 DIAGNOSIS — M9901 Segmental and somatic dysfunction of cervical region: Secondary | ICD-10-CM | POA: Diagnosis not present

## 2018-10-17 DIAGNOSIS — M4306 Spondylolysis, lumbar region: Secondary | ICD-10-CM | POA: Diagnosis not present

## 2018-10-17 DIAGNOSIS — M9903 Segmental and somatic dysfunction of lumbar region: Secondary | ICD-10-CM | POA: Diagnosis not present

## 2018-10-17 DIAGNOSIS — M4303 Spondylolysis, cervicothoracic region: Secondary | ICD-10-CM | POA: Diagnosis not present

## 2018-10-28 DIAGNOSIS — N3090 Cystitis, unspecified without hematuria: Secondary | ICD-10-CM | POA: Diagnosis not present

## 2018-10-28 DIAGNOSIS — Z6823 Body mass index (BMI) 23.0-23.9, adult: Secondary | ICD-10-CM | POA: Diagnosis not present

## 2018-11-11 DIAGNOSIS — M4306 Spondylolysis, lumbar region: Secondary | ICD-10-CM | POA: Diagnosis not present

## 2018-11-11 DIAGNOSIS — M9903 Segmental and somatic dysfunction of lumbar region: Secondary | ICD-10-CM | POA: Diagnosis not present

## 2018-11-11 DIAGNOSIS — M4303 Spondylolysis, cervicothoracic region: Secondary | ICD-10-CM | POA: Diagnosis not present

## 2018-11-11 DIAGNOSIS — M9901 Segmental and somatic dysfunction of cervical region: Secondary | ICD-10-CM | POA: Diagnosis not present

## 2018-11-20 DIAGNOSIS — H401131 Primary open-angle glaucoma, bilateral, mild stage: Secondary | ICD-10-CM | POA: Diagnosis not present

## 2018-11-21 DIAGNOSIS — M4304 Spondylolysis, thoracic region: Secondary | ICD-10-CM | POA: Diagnosis not present

## 2018-11-21 DIAGNOSIS — M4727 Other spondylosis with radiculopathy, lumbosacral region: Secondary | ICD-10-CM | POA: Diagnosis not present

## 2018-11-21 DIAGNOSIS — M9903 Segmental and somatic dysfunction of lumbar region: Secondary | ICD-10-CM | POA: Diagnosis not present

## 2018-11-21 DIAGNOSIS — M9902 Segmental and somatic dysfunction of thoracic region: Secondary | ICD-10-CM | POA: Diagnosis not present

## 2018-12-10 DIAGNOSIS — Z6823 Body mass index (BMI) 23.0-23.9, adult: Secondary | ICD-10-CM | POA: Diagnosis not present

## 2018-12-10 DIAGNOSIS — M19041 Primary osteoarthritis, right hand: Secondary | ICD-10-CM | POA: Diagnosis not present

## 2018-12-10 DIAGNOSIS — M19042 Primary osteoarthritis, left hand: Secondary | ICD-10-CM | POA: Diagnosis not present

## 2018-12-10 DIAGNOSIS — M159 Polyosteoarthritis, unspecified: Secondary | ICD-10-CM | POA: Diagnosis not present

## 2018-12-25 DIAGNOSIS — M19041 Primary osteoarthritis, right hand: Secondary | ICD-10-CM | POA: Diagnosis not present

## 2018-12-25 DIAGNOSIS — M79643 Pain in unspecified hand: Secondary | ICD-10-CM | POA: Diagnosis not present

## 2018-12-25 DIAGNOSIS — M6281 Muscle weakness (generalized): Secondary | ICD-10-CM | POA: Diagnosis not present

## 2018-12-25 DIAGNOSIS — M25649 Stiffness of unspecified hand, not elsewhere classified: Secondary | ICD-10-CM | POA: Diagnosis not present

## 2019-01-02 DIAGNOSIS — M9905 Segmental and somatic dysfunction of pelvic region: Secondary | ICD-10-CM | POA: Diagnosis not present

## 2019-01-02 DIAGNOSIS — M5431 Sciatica, right side: Secondary | ICD-10-CM | POA: Diagnosis not present

## 2019-01-02 DIAGNOSIS — M4304 Spondylolysis, thoracic region: Secondary | ICD-10-CM | POA: Diagnosis not present

## 2019-01-02 DIAGNOSIS — M9902 Segmental and somatic dysfunction of thoracic region: Secondary | ICD-10-CM | POA: Diagnosis not present

## 2019-02-01 DIAGNOSIS — L570 Actinic keratosis: Secondary | ICD-10-CM | POA: Diagnosis not present

## 2019-02-01 DIAGNOSIS — L821 Other seborrheic keratosis: Secondary | ICD-10-CM | POA: Diagnosis not present

## 2019-02-05 DIAGNOSIS — M5431 Sciatica, right side: Secondary | ICD-10-CM | POA: Diagnosis not present

## 2019-02-05 DIAGNOSIS — M9901 Segmental and somatic dysfunction of cervical region: Secondary | ICD-10-CM | POA: Diagnosis not present

## 2019-02-05 DIAGNOSIS — M9905 Segmental and somatic dysfunction of pelvic region: Secondary | ICD-10-CM | POA: Diagnosis not present

## 2019-02-05 DIAGNOSIS — M4302 Spondylolysis, cervical region: Secondary | ICD-10-CM | POA: Diagnosis not present

## 2019-03-04 DIAGNOSIS — M4723 Other spondylosis with radiculopathy, cervicothoracic region: Secondary | ICD-10-CM | POA: Diagnosis not present

## 2019-03-04 DIAGNOSIS — M9901 Segmental and somatic dysfunction of cervical region: Secondary | ICD-10-CM | POA: Diagnosis not present

## 2019-03-04 DIAGNOSIS — M4727 Other spondylosis with radiculopathy, lumbosacral region: Secondary | ICD-10-CM | POA: Diagnosis not present

## 2019-03-04 DIAGNOSIS — M9903 Segmental and somatic dysfunction of lumbar region: Secondary | ICD-10-CM | POA: Diagnosis not present

## 2019-03-20 DIAGNOSIS — M67922 Unspecified disorder of synovium and tendon, left upper arm: Secondary | ICD-10-CM | POA: Diagnosis not present

## 2019-04-02 DIAGNOSIS — H524 Presbyopia: Secondary | ICD-10-CM | POA: Diagnosis not present

## 2019-04-02 DIAGNOSIS — H04123 Dry eye syndrome of bilateral lacrimal glands: Secondary | ICD-10-CM | POA: Diagnosis not present

## 2019-04-02 DIAGNOSIS — Z961 Presence of intraocular lens: Secondary | ICD-10-CM | POA: Diagnosis not present

## 2019-04-02 DIAGNOSIS — H401132 Primary open-angle glaucoma, bilateral, moderate stage: Secondary | ICD-10-CM | POA: Diagnosis not present

## 2019-04-03 DIAGNOSIS — M9903 Segmental and somatic dysfunction of lumbar region: Secondary | ICD-10-CM | POA: Diagnosis not present

## 2019-04-03 DIAGNOSIS — M4723 Other spondylosis with radiculopathy, cervicothoracic region: Secondary | ICD-10-CM | POA: Diagnosis not present

## 2019-04-03 DIAGNOSIS — M4727 Other spondylosis with radiculopathy, lumbosacral region: Secondary | ICD-10-CM | POA: Diagnosis not present

## 2019-04-03 DIAGNOSIS — M9901 Segmental and somatic dysfunction of cervical region: Secondary | ICD-10-CM | POA: Diagnosis not present

## 2019-05-02 DIAGNOSIS — I10 Essential (primary) hypertension: Secondary | ICD-10-CM | POA: Diagnosis not present

## 2019-05-02 DIAGNOSIS — Z6824 Body mass index (BMI) 24.0-24.9, adult: Secondary | ICD-10-CM | POA: Diagnosis not present

## 2019-05-02 DIAGNOSIS — K219 Gastro-esophageal reflux disease without esophagitis: Secondary | ICD-10-CM | POA: Diagnosis not present

## 2019-05-02 DIAGNOSIS — Z79899 Other long term (current) drug therapy: Secondary | ICD-10-CM | POA: Diagnosis not present

## 2019-05-02 DIAGNOSIS — Z1331 Encounter for screening for depression: Secondary | ICD-10-CM | POA: Diagnosis not present

## 2019-05-02 DIAGNOSIS — E663 Overweight: Secondary | ICD-10-CM | POA: Diagnosis not present

## 2019-05-02 DIAGNOSIS — E539 Vitamin B deficiency, unspecified: Secondary | ICD-10-CM | POA: Diagnosis not present

## 2019-05-02 DIAGNOSIS — E785 Hyperlipidemia, unspecified: Secondary | ICD-10-CM | POA: Diagnosis not present

## 2019-05-02 DIAGNOSIS — Z0000 Encounter for general adult medical examination without abnormal findings: Secondary | ICD-10-CM | POA: Diagnosis not present

## 2019-05-02 DIAGNOSIS — M159 Polyosteoarthritis, unspecified: Secondary | ICD-10-CM | POA: Diagnosis not present

## 2019-05-02 DIAGNOSIS — Z9181 History of falling: Secondary | ICD-10-CM | POA: Diagnosis not present

## 2019-05-02 DIAGNOSIS — M7522 Bicipital tendinitis, left shoulder: Secondary | ICD-10-CM | POA: Diagnosis not present

## 2019-05-13 DIAGNOSIS — J988 Other specified respiratory disorders: Secondary | ICD-10-CM | POA: Diagnosis not present

## 2019-05-13 DIAGNOSIS — Z20828 Contact with and (suspected) exposure to other viral communicable diseases: Secondary | ICD-10-CM | POA: Diagnosis not present

## 2019-05-13 DIAGNOSIS — R0789 Other chest pain: Secondary | ICD-10-CM | POA: Diagnosis not present

## 2019-05-14 ENCOUNTER — Other Ambulatory Visit: Payer: Self-pay

## 2019-05-14 ENCOUNTER — Inpatient Hospital Stay (HOSPITAL_COMMUNITY)
Admission: AD | Admit: 2019-05-14 | Discharge: 2019-05-26 | DRG: 177 | Disposition: A | Discharge status: Home Health | Payer: PPO | Source: Other Acute Inpatient Hospital | Attending: Internal Medicine | Admitting: Internal Medicine

## 2019-05-14 ENCOUNTER — Encounter (HOSPITAL_COMMUNITY): Payer: Self-pay

## 2019-05-14 DIAGNOSIS — E875 Hyperkalemia: Secondary | ICD-10-CM | POA: Diagnosis not present

## 2019-05-14 DIAGNOSIS — T380X5A Adverse effect of glucocorticoids and synthetic analogues, initial encounter: Secondary | ICD-10-CM | POA: Diagnosis not present

## 2019-05-14 DIAGNOSIS — I10 Essential (primary) hypertension: Secondary | ICD-10-CM | POA: Diagnosis present

## 2019-05-14 DIAGNOSIS — R339 Retention of urine, unspecified: Secondary | ICD-10-CM | POA: Diagnosis present

## 2019-05-14 DIAGNOSIS — N179 Acute kidney failure, unspecified: Secondary | ICD-10-CM | POA: Diagnosis present

## 2019-05-14 DIAGNOSIS — N183 Chronic kidney disease, stage 3 unspecified: Secondary | ICD-10-CM | POA: Diagnosis not present

## 2019-05-14 DIAGNOSIS — R0602 Shortness of breath: Secondary | ICD-10-CM

## 2019-05-14 DIAGNOSIS — U071 COVID-19: Principal | ICD-10-CM

## 2019-05-14 DIAGNOSIS — R069 Unspecified abnormalities of breathing: Secondary | ICD-10-CM | POA: Diagnosis not present

## 2019-05-14 DIAGNOSIS — R7989 Other specified abnormal findings of blood chemistry: Secondary | ICD-10-CM | POA: Diagnosis not present

## 2019-05-14 DIAGNOSIS — I129 Hypertensive chronic kidney disease with stage 1 through stage 4 chronic kidney disease, or unspecified chronic kidney disease: Secondary | ICD-10-CM | POA: Diagnosis not present

## 2019-05-14 DIAGNOSIS — J9601 Acute respiratory failure with hypoxia: Secondary | ICD-10-CM | POA: Diagnosis not present

## 2019-05-14 DIAGNOSIS — R0902 Hypoxemia: Secondary | ICD-10-CM | POA: Diagnosis not present

## 2019-05-14 DIAGNOSIS — E871 Hypo-osmolality and hyponatremia: Secondary | ICD-10-CM | POA: Diagnosis not present

## 2019-05-14 DIAGNOSIS — R05 Cough: Secondary | ICD-10-CM | POA: Diagnosis not present

## 2019-05-14 DIAGNOSIS — J1289 Other viral pneumonia: Secondary | ICD-10-CM | POA: Diagnosis not present

## 2019-05-14 DIAGNOSIS — E1165 Type 2 diabetes mellitus with hyperglycemia: Secondary | ICD-10-CM | POA: Diagnosis not present

## 2019-05-14 DIAGNOSIS — J1282 Pneumonia due to coronavirus disease 2019: Secondary | ICD-10-CM | POA: Diagnosis present

## 2019-05-14 HISTORY — DX: Essential (primary) hypertension: I10

## 2019-05-14 LAB — C-REACTIVE PROTEIN: CRP: 19.2 mg/dL — ABNORMAL HIGH (ref <1.0)

## 2019-05-14 LAB — CBC WITH DIFFERENTIAL/PLATELET
Abs Immature Granulocytes: 0.77 10*3/uL — ABNORMAL HIGH (ref 0.00–0.07)
Basophils Absolute: 0.1 10*3/uL (ref 0.0–0.1)
Basophils Relative: 1 %
Eosinophils Absolute: 0 10*3/uL (ref 0.0–0.5)
Eosinophils Relative: 0 %
HCT: 37.8 % (ref 36.0–46.0)
Hemoglobin: 12.7 g/dL (ref 12.0–15.0)
Immature Granulocytes: 10 %
Lymphocytes Relative: 17 %
Lymphs Abs: 1.3 10*3/uL (ref 0.7–4.0)
MCH: 32.1 pg (ref 26.0–34.0)
MCHC: 33.6 g/dL (ref 30.0–36.0)
MCV: 95.5 fL (ref 80.0–100.0)
Monocytes Absolute: 0.5 10*3/uL (ref 0.1–1.0)
Monocytes Relative: 6 %
Neutro Abs: 5 10*3/uL (ref 1.7–7.7)
Neutrophils Relative %: 66 %
Platelets: 231 10*3/uL (ref 150–400)
RBC: 3.96 MIL/uL (ref 3.87–5.11)
RDW: 13.6 % (ref 11.5–15.5)
WBC: 7.6 10*3/uL (ref 4.0–10.5)
nRBC: 0 % (ref 0.0–0.2)

## 2019-05-14 LAB — D-DIMER, QUANTITATIVE: D-Dimer, Quant: 1.22 ug/mL-FEU — ABNORMAL HIGH (ref 0.00–0.50)

## 2019-05-14 LAB — COMPREHENSIVE METABOLIC PANEL
ALT: 23 U/L (ref 0–44)
AST: 62 U/L — ABNORMAL HIGH (ref 15–41)
Albumin: 3 g/dL — ABNORMAL LOW (ref 3.5–5.0)
Alkaline Phosphatase: 100 U/L (ref 38–126)
Anion gap: 12 (ref 5–15)
BUN: 44 mg/dL — ABNORMAL HIGH (ref 8–23)
CO2: 19 mmol/L — ABNORMAL LOW (ref 22–32)
Calcium: 9 mg/dL (ref 8.9–10.3)
Chloride: 112 mmol/L — ABNORMAL HIGH (ref 98–111)
Creatinine, Ser: 1.43 mg/dL — ABNORMAL HIGH (ref 0.44–1.00)
GFR calc Af Amer: 40 mL/min — ABNORMAL LOW (ref >60)
GFR calc non Af Amer: 34 mL/min — ABNORMAL LOW (ref >60)
Glucose, Bld: 222 mg/dL — ABNORMAL HIGH (ref 70–99)
Potassium: 4.9 mmol/L (ref 3.5–5.1)
Sodium: 143 mmol/L (ref 135–145)
Total Bilirubin: 0.5 mg/dL (ref 0.3–1.2)
Total Protein: 7.3 g/dL (ref 6.5–8.1)

## 2019-05-14 LAB — TYPE AND SCREEN
ABO/RH(D): O POS
Antibody Screen: NEGATIVE

## 2019-05-14 LAB — HEMOGLOBIN A1C
Hgb A1c MFr Bld: 6.7 % — ABNORMAL HIGH (ref 4.8–5.6)
Mean Plasma Glucose: 145.59 mg/dL

## 2019-05-14 LAB — GLUCOSE, CAPILLARY: Glucose-Capillary: 158 mg/dL — ABNORMAL HIGH (ref 70–99)

## 2019-05-14 LAB — BRAIN NATRIURETIC PEPTIDE: B Natriuretic Peptide: 103.9 pg/mL — ABNORMAL HIGH (ref 0.0–100.0)

## 2019-05-14 LAB — ABO/RH: ABO/RH(D): O POS

## 2019-05-14 LAB — FERRITIN: Ferritin: 1177 ng/mL — ABNORMAL HIGH (ref 11–307)

## 2019-05-14 MED ORDER — METHYLPREDNISOLONE SODIUM SUCC 40 MG IJ SOLR
0.5000 mg/kg | Freq: Two times a day (BID) | INTRAMUSCULAR | Status: DC
  Administered 2019-05-14: 27.2 mg via INTRAVENOUS
  Filled 2019-05-14: qty 1

## 2019-05-14 MED ORDER — VITAMIN C 500 MG PO TABS
500.0000 mg | ORAL_TABLET | Freq: Every day | ORAL | Status: DC
  Administered 2019-05-14 – 2019-05-26 (×13): 500 mg via ORAL
  Filled 2019-05-14 (×13): qty 1

## 2019-05-14 MED ORDER — ONDANSETRON HCL 4 MG/2ML IJ SOLN: 4.0000 mg | Freq: Four times a day (QID) | INTRAMUSCULAR | Status: DC | PRN

## 2019-05-14 MED ORDER — LORAZEPAM 0.5 MG PO TABS
0.5000 mg | ORAL_TABLET | Freq: Four times a day (QID) | ORAL | Status: DC | PRN
  Administered 2019-05-14 – 2019-05-17 (×4): 0.5 mg via ORAL
  Filled 2019-05-14 (×4): qty 1

## 2019-05-14 MED ORDER — HYDROCOD POLST-CPM POLST ER 10-8 MG/5ML PO SUER
5.0000 mL | Freq: Two times a day (BID) | ORAL | Status: DC | PRN
  Administered 2019-05-14 – 2019-05-20 (×9): 5 mL via ORAL
  Filled 2019-05-14 (×9): qty 5

## 2019-05-14 MED ORDER — FUROSEMIDE 10 MG/ML IJ SOLN
40.0000 mg | Freq: Once | INTRAMUSCULAR | Status: AC
  Administered 2019-05-14: 40 mg via INTRAVENOUS
  Filled 2019-05-14: qty 4

## 2019-05-14 MED ORDER — TOCILIZUMAB 400 MG/20ML IV SOLN
400.0000 mg | Freq: Once | INTRAVENOUS | Status: AC
  Administered 2019-05-14: 400 mg via INTRAVENOUS
  Filled 2019-05-14: qty 20

## 2019-05-14 MED ORDER — ONDANSETRON HCL 4 MG PO TABS: 4.0000 mg | ORAL_TABLET | Freq: Four times a day (QID) | ORAL | Status: DC | PRN

## 2019-05-14 MED ORDER — ACETAMINOPHEN 325 MG PO TABS
650.0000 mg | ORAL_TABLET | Freq: Four times a day (QID) | ORAL | Status: DC | PRN
  Administered 2019-05-14 – 2019-05-16 (×4): 650 mg via ORAL
  Filled 2019-05-14 (×3): qty 2

## 2019-05-14 MED ORDER — SODIUM CHLORIDE 0.9 % IV SOLN
100.0000 mg | INTRAVENOUS | Status: DC
  Administered 2019-05-15 – 2019-05-16 (×2): 100 mg via INTRAVENOUS
  Filled 2019-05-14 (×2): qty 100

## 2019-05-14 MED ORDER — ENOXAPARIN SODIUM 40 MG/0.4ML ~~LOC~~ SOLN
40.0000 mg | SUBCUTANEOUS | Status: DC
  Administered 2019-05-14: 40 mg via SUBCUTANEOUS
  Filled 2019-05-14: qty 0.4

## 2019-05-14 MED ORDER — TRAMADOL HCL 50 MG PO TABS
50.0000 mg | ORAL_TABLET | Freq: Four times a day (QID) | ORAL | Status: DC | PRN
  Administered 2019-05-14 – 2019-05-19 (×3): 50 mg via ORAL
  Filled 2019-05-14 (×3): qty 1

## 2019-05-14 MED ORDER — SODIUM CHLORIDE 0.9% IV SOLUTION: Freq: Once | INTRAVENOUS | Status: DC

## 2019-05-14 MED ORDER — POLYETHYLENE GLYCOL 3350 17 G PO PACK
17.0000 g | PACK | Freq: Every day | ORAL | Status: DC | PRN
  Administered 2019-05-21 – 2019-05-23 (×3): 17 g via ORAL
  Filled 2019-05-14 (×3): qty 1

## 2019-05-14 MED ORDER — ACETAMINOPHEN 325 MG PO TABS
650.0000 mg | ORAL_TABLET | Freq: Once | ORAL | Status: DC
  Filled 2019-05-14: qty 2

## 2019-05-14 MED ORDER — ZINC SULFATE 220 (50 ZN) MG PO CAPS
220.0000 mg | ORAL_CAPSULE | Freq: Every day | ORAL | Status: DC
  Administered 2019-05-14 – 2019-05-26 (×13): 220 mg via ORAL
  Filled 2019-05-14 (×13): qty 1

## 2019-05-14 MED ORDER — ORAL CARE MOUTH RINSE
15.0000 mL | Freq: Two times a day (BID) | OROMUCOSAL | Status: DC
  Administered 2019-05-14 – 2019-05-26 (×24): 15 mL via OROMUCOSAL

## 2019-05-14 MED ORDER — DIPHENHYDRAMINE HCL 50 MG/ML IJ SOLN: 25.0000 mg | Freq: Once | INTRAMUSCULAR | Status: DC

## 2019-05-14 MED ORDER — GUAIFENESIN-DM 100-10 MG/5ML PO SYRP
10.0000 mL | ORAL_SOLUTION | ORAL | Status: DC | PRN
  Administered 2019-05-16 – 2019-05-24 (×11): 10 mL via ORAL
  Filled 2019-05-14 (×13): qty 10

## 2019-05-14 MED ORDER — INSULIN ASPART 100 UNIT/ML ~~LOC~~ SOLN
0.0000 [IU] | Freq: Three times a day (TID) | SUBCUTANEOUS | Status: DC
  Administered 2019-05-14: 2 [IU] via SUBCUTANEOUS
  Administered 2019-05-15 (×2): 3 [IU] via SUBCUTANEOUS
  Administered 2019-05-15: 2 [IU] via SUBCUTANEOUS
  Administered 2019-05-16: 3 [IU] via SUBCUTANEOUS
  Administered 2019-05-16: 2 [IU] via SUBCUTANEOUS
  Administered 2019-05-16: 3 [IU] via SUBCUTANEOUS
  Administered 2019-05-17: 5 [IU] via SUBCUTANEOUS
  Administered 2019-05-17: 3 [IU] via SUBCUTANEOUS
  Administered 2019-05-17: 2 [IU] via SUBCUTANEOUS
  Administered 2019-05-18: 7 [IU] via SUBCUTANEOUS
  Administered 2019-05-18: 1 [IU] via SUBCUTANEOUS
  Administered 2019-05-18: 3 [IU] via SUBCUTANEOUS
  Administered 2019-05-19: 7 [IU] via SUBCUTANEOUS
  Administered 2019-05-19: 1 [IU] via SUBCUTANEOUS
  Administered 2019-05-19: 5 [IU] via SUBCUTANEOUS
  Administered 2019-05-20: 1 [IU] via SUBCUTANEOUS
  Administered 2019-05-20: 5 [IU] via SUBCUTANEOUS
  Administered 2019-05-20: 1 [IU] via SUBCUTANEOUS
  Administered 2019-05-21: 3 [IU] via SUBCUTANEOUS
  Administered 2019-05-21: 5 [IU] via SUBCUTANEOUS
  Administered 2019-05-21: 2 [IU] via SUBCUTANEOUS
  Administered 2019-05-22 (×2): 3 [IU] via SUBCUTANEOUS
  Administered 2019-05-23: 7 [IU] via SUBCUTANEOUS
  Administered 2019-05-23: 5 [IU] via SUBCUTANEOUS
  Administered 2019-05-24: 3 [IU] via SUBCUTANEOUS
  Administered 2019-05-24: 2 [IU] via SUBCUTANEOUS
  Administered 2019-05-25: 1 [IU] via SUBCUTANEOUS
  Administered 2019-05-25 (×2): 3 [IU] via SUBCUTANEOUS
  Administered 2019-05-26: 2 [IU] via SUBCUTANEOUS
  Administered 2019-05-26: 1 [IU] via SUBCUTANEOUS

## 2019-05-14 NOTE — H&P (Signed)
HISTORY AND PHYSICAL       PATIENT DETAILS Name: Virginia Chan Age: 83 y.o. Sex: female Date of Birth: [birth year] Admit Date: 05/14/2019 [id number], [name], MD   Patient coming from: Home >>RH>>[facility]   CHIEF COMPLAINT:  Shortness of breath  HPI: Virginia Chan is a 83 y.o. female with medical history significant of hypertension-who developed fatigue/malaise approximately 1 week back.  She then gradually started having intermittent fever and myalgias.  Over the past 2-3 days she started having shortness of breath-that worsened markedly yesterday.  She subsequently presented to [facility] emergency room-where she was found to be profoundly hypoxic requiring NRB to maintain O2 saturations.  Her chest x-ray was positive for spelt patchy multifocal pneumonia.  She also was positive for COVID-19 at [facility].  She was subsequently transferred to [facility] service for further evaluation and treatment.  Patient currently denies any nausea, vomiting, diarrhea, chest pain, abdominal pain.    Although hypoxic-she is lying comfortably in bed.   Note: Lives at: Home Mobility:  Independent Chronic Indwelling Foley:no   REVIEW OF SYSTEMS:  Constitutional:   No  weight loss, night sweats  HEENT:    No headaches, Dysphagia,Tooth/dental problems,Sore throat,  No sneezing, itching, ear ache, nasal congestion, post nasal drip  Cardio-vascular: No chest pain,Orthopnea, PND,lower extremity edema, anasarca, palpitations  GI:  No heartburn, indigestion, abdominal pain, nausea, vomiting, diarrhea, melena or hematochezia  Resp: No shortness of breath, cough, hemoptysis,plueritic chest pain.   Skin:  No rash or lesions.  GU:  No dysuria, change in color of urine, no urgency or frequency.  No flank pain.  Musculoskeletal: No joint pain or swelling.  No decreased range of motion.  No back pain.  Endocrine: No heat intolerance, no cold intolerance, no  polyuria, no polydipsia  Psych: No change in mood or affect. No depression or anxiety.  No memory loss.   ALLERGIES: Denies any known allergies  PAST MEDICAL HISTORY: Past Medical History:  Diagnosis Date   Hypertension     PAST SURGICAL HISTORY: Past Surgical History:  Procedure Laterality Date   ABDOMINAL HYSTERECTOMY     BREAST LUMPECTOMY Left    CHOLECYSTECTOMY      MEDICATIONS AT HOME: Prior to Admission medications   Not on File    FAMILY HISTORY: Denies family history of CAD  SOCIAL HISTORY:  reports that she has never smoked. She has never used smokeless tobacco. She reports that she does not drink alcohol or use drugs.  PHYSICAL EXAM: There were no vitals taken for this visit.  General appearance :Awake, alert, not in any distress.  Eyes:, pupils equally reactive to light and accomodation,no scleral icterus.Pink conjunctiva HEENT: Atraumatic and Normocephalic Neck: supple, no JVD.  Resp:Good air entry bilaterally, appears to have fine bibasilar rales CVS: S1 S2 regular, no murmurs.  GI: Bowel sounds present, Non tender and not distended with no gaurding, rigidity or rebound. Extremities: B/L Lower Ext shows no edema, both legs are warm to touch Neurology:  speech clear,Non focal, sensation is grossly intact. Psychiatric: Normal judgment and insight. Alert and oriented x 3. Normal mood. Musculoskeletal:gait appears to be normal.No digital cyanosis Skin:No Rash, warm and dry Wounds:N/A  LABS ON ADMISSION:  I have personally reviewed following labs and imaging studies  CBC: No results for input(s): WBC, NEUTROABS, HGB, HCT, MCV, PLT in the last 168 hours.  Basic Metabolic Panel: No results for input(s): NA, K, CL, CO2, GLUCOSE, BUN,  CREATININE, CALCIUM, MG, PHOS in the last 168 hours.  GFR: CrCl cannot be calculated (No successful lab value found.).  Liver Function Tests: No results for input(s): AST, ALT, ALKPHOS, BILITOT, PROT, ALBUMIN in  the last 168 hours. No results for input(s): LIPASE, AMYLASE in the last 168 hours. No results for input(s): AMMONIA in the last 168 hours.  Coagulation Profile: No results for input(s): INR, PROTIME in the last 168 hours.  Cardiac Enzymes: No results for input(s): CKTOTAL, CKMB, CKMBINDEX, TROPONINI in the last 168 hours.  BNP (last 3 results) No results for input(s): PROBNP in the last 8760 hours.  HbA1C: No results for input(s): HGBA1C in the last 72 hours.  CBG: Recent Labs  Lab 05/14/19 1740  GLUCAP 158*    Lipid Profile: No results for input(s): CHOL, HDL, LDLCALC, TRIG, CHOLHDL, LDLDIRECT in the last 72 hours.  Thyroid Function Tests: No results for input(s): TSH, T4TOTAL, FREET4, T3FREE, THYROIDAB in the last 72 hours.  Anemia Panel: No results for input(s): VITAMINB12, FOLATE, FERRITIN, TIBC, IRON, RETICCTPCT in the last 72 hours.  Urine analysis: No results found for: COLORURINE, APPEARANCEUR, LABSPEC, PHURINE, GLUCOSEU, HGBUR, BILIRUBINUR, KETONESUR, PROTEINUR, UROBILINOGEN, NITRITE, LEUKOCYTESUR  Sepsis Labs: Lactic Acid, Venous No results found for: [place]   Microbiology: No results found for this or any previous visit (from the past 240 hour(s)).    RADIOLOGIC STUDIES ON ADMISSION: No results found.  I have personally reviewed images of chest xray -diffuse multifocal pneumonia  EKG:  Not performed  ASSESSMENT AND PLAN: Acute hypoxemic respiratory failure secondary to COVID-19 pneumonia: Requiring NRB at [facility] health-she was transitioned to high flow oxygen-currently requiring around 10 to 12 L.  She appears comfortable and not in any distress.  Plans are to treat with steroids and remdesivir.  I also explained rationale/risks/benefits of convalescent plasma and Actemra-patient consents to the use of both of these agents (will order after labs obtained).  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  The rationale for the off label use of Actemra its known side effects, potential benefits was  discussed with patient.The use of Actemra is based on published clinical articles/anecdotal data as there is no demonstrated efficacy in several randomized trials, however some randomized control trials lately have demonstrated efficacy. Complete risks and long-term side effects are unknown, however in the best clinical judgment it is felt that the clinical benefit at this time outweighs medical risks given tenuous clinical state of the patient.  Patient denies any prior history of tuberculosis or hepatitis. Patient agree's with the treatment plan and consent to the use of Actemra.  HTN: Hold antihypertensives-follow BP.  Further plan will depend as patient's clinical course evolves and further radiologic and laboratory data become available. Patient will be monitored closely.  Above noted plan was discussed with patient/ face to face at bedside, she was in agreement.  I subsequently updated the patient's son over the phone regarding the above treatment plans as well.  CONSULTS: None  DVT Prophylaxis: Prophylactic Lovenox   Code Status: Full Code  Disposition Plan:  Discharge back home vs SNF possibly in 5-6 days, depending on clinical course  Admission status:  Inpatient vs observation going to SDU  The patient is critically ill with multiple organ system failure and requires high complexity decision making for assessment and support, frequent evaluation and titration of therapies, advanced monitoring, review of radiographic  studies and interpretation of complex data. *\     Total time spent  55 minutes.Greater than 50% of this time was spent in counseling, explanation of diagnosis, planning of further management, and coordination of care.  Severity of illness: The appropriate patient status for this  patient is INPATIENT. Inpatient status is judged to be reasonable and necessary in order to provide the required intensity of service to ensure the patient's safety. The patient's presenting symptoms, physical exam findings, and initial radiographic and laboratory data in the context of their chronic comorbidities is felt to place them at high risk for further clinical deterioration. Furthermore, it is not anticipated that the patient will be medically stable for discharge from the hospital within 2 midnights of admission. The following factors support the patient status of inpatient.   " The patient's presenting symptoms include shortness of breath " The worrisome physical exam findings include hypoxemia " The initial radiographic and laboratory data are worrisome because of chest x-ray with diffuse infiltrates " The chronic co-morbidities include hypertension   * I certify that at the point of admission it is my clinical judgment that the patient will require inpatient hospital care spanning beyond 2 midnights from the point of admission due to high intensity of service, high risk for further deterioration and high frequency of surveillance required.  Oren Binet [facility] Pager [phone]  If 7PM-7AM, please contact night-coverage  Please page via [email or web address]  Go to [email or web address] and use [place]'s universal password to access. If you do not have the password, please contact the hospital operator.  Locate the [facility] provider you are looking for under [facility] and page to a number that you can be directly reached. If you still have difficulty reaching the provider, please page the [facility] (Director on Call) for the Hospitalists listed on amion for assistance.  05/14/2019, 6:00 PM

## 2019-05-15 DIAGNOSIS — N179 Acute kidney failure, unspecified: Secondary | ICD-10-CM

## 2019-05-15 LAB — C-REACTIVE PROTEIN: CRP: 19.1 mg/dL — ABNORMAL HIGH (ref <1.0)

## 2019-05-15 LAB — GLUCOSE, CAPILLARY
Glucose-Capillary: 213 mg/dL — ABNORMAL HIGH (ref 70–99)
Glucose-Capillary: 219 mg/dL — ABNORMAL HIGH (ref 70–99)
Glucose-Capillary: 122 mg/dL — ABNORMAL HIGH (ref 70–99)

## 2019-05-15 LAB — COMPREHENSIVE METABOLIC PANEL
ALT: 21 U/L (ref 0–44)
AST: 58 U/L — ABNORMAL HIGH (ref 15–41)
Albumin: 3.2 g/dL — ABNORMAL LOW (ref 3.5–5.0)
Alkaline Phosphatase: 99 U/L (ref 38–126)
Anion gap: 13 (ref 5–15)
BUN: 55 mg/dL — ABNORMAL HIGH (ref 8–23)
CO2: 22 mmol/L (ref 22–32)
Calcium: 9 mg/dL (ref 8.9–10.3)
Chloride: 107 mmol/L (ref 98–111)
Creatinine, Ser: 1.65 mg/dL — ABNORMAL HIGH (ref 0.44–1.00)
GFR calc Af Amer: 33 mL/min — ABNORMAL LOW (ref >60)
GFR calc non Af Amer: 29 mL/min — ABNORMAL LOW (ref >60)
Glucose, Bld: 173 mg/dL — ABNORMAL HIGH (ref 70–99)
Potassium: 4.5 mmol/L (ref 3.5–5.1)
Sodium: 142 mmol/L (ref 135–145)
Total Bilirubin: 0.7 mg/dL (ref 0.3–1.2)
Total Protein: 7.4 g/dL (ref 6.5–8.1)

## 2019-05-15 LAB — CBC WITH DIFFERENTIAL/PLATELET
Abs Immature Granulocytes: 0.7 10*3/uL — ABNORMAL HIGH (ref 0.00–0.07)
Band Neutrophils: 5 %
Basophils Absolute: 0 10*3/uL (ref 0.0–0.1)
Basophils Relative: 0 %
Eosinophils Absolute: 0 10*3/uL (ref 0.0–0.5)
Eosinophils Relative: 0 %
HCT: 38.7 % (ref 36.0–46.0)
Hemoglobin: 12.8 g/dL (ref 12.0–15.0)
Lymphocytes Relative: 10 %
Lymphs Abs: 1 10*3/uL (ref 0.7–4.0)
MCH: 31.5 pg (ref 26.0–34.0)
MCHC: 33.1 g/dL (ref 30.0–36.0)
MCV: 95.3 fL (ref 80.0–100.0)
Metamyelocytes Relative: 1 %
Monocytes Absolute: 0.8 10*3/uL (ref 0.1–1.0)
Monocytes Relative: 8 %
Myelocytes: 6 %
Neutro Abs: 7.7 10*3/uL (ref 1.7–7.7)
Neutrophils Relative %: 70 %
Platelets: 282 10*3/uL (ref 150–400)
RBC: 4.06 MIL/uL (ref 3.87–5.11)
RDW: 14 % (ref 11.5–15.5)
WBC: 10.3 10*3/uL (ref 4.0–10.5)
nRBC: 0 % (ref 0.0–0.2)

## 2019-05-15 LAB — FERRITIN: Ferritin: 1227 ng/mL — ABNORMAL HIGH (ref 11–307)

## 2019-05-15 LAB — D-DIMER, QUANTITATIVE: D-Dimer, Quant: 0.97 ug/mL-FEU — ABNORMAL HIGH (ref 0.00–0.50)

## 2019-05-15 MED ORDER — HEPARIN SODIUM (PORCINE) 5000 UNIT/ML IJ SOLN: 5000.0000 [IU] | Freq: Three times a day (TID) | INTRAMUSCULAR | Status: DC

## 2019-05-15 MED ORDER — METHYLPREDNISOLONE SODIUM SUCC 40 MG IJ SOLR
40.0000 mg | Freq: Two times a day (BID) | INTRAMUSCULAR | Status: DC
  Administered 2019-05-15 – 2019-05-16 (×4): 40 mg via INTRAVENOUS
  Filled 2019-05-15 (×4): qty 1

## 2019-05-15 MED ORDER — HYDRALAZINE HCL 20 MG/ML IJ SOLN: 10.0000 mg | Freq: Four times a day (QID) | INTRAMUSCULAR | Status: DC | PRN

## 2019-05-15 MED ORDER — AMLODIPINE BESYLATE 5 MG PO TABS
5.0000 mg | ORAL_TABLET | Freq: Every day | ORAL | Status: DC
  Administered 2019-05-15 – 2019-05-21 (×7): 5 mg via ORAL
  Filled 2019-05-15 (×7): qty 1

## 2019-05-15 MED ORDER — HEPARIN SODIUM (PORCINE) 10000 UNIT/ML IJ SOLN
7500.0000 [IU] | Freq: Three times a day (TID) | INTRAMUSCULAR | Status: DC
  Administered 2019-05-15 – 2019-05-26 (×32): 7500 [IU] via SUBCUTANEOUS
  Filled 2019-05-15 (×32): qty 1

## 2019-05-15 NOTE — Progress Notes (Signed)
Patient o2sat dropped to 80's at 10L O2NC. RN increased o2 to 15LHFNC. Will continue to monitor.

## 2019-05-15 NOTE — Progress Notes (Signed)
PROGRESS NOTE                                                                                                                                                                                                             Patient Demographics:    Virginia Chan, is a 83 y.o. female, DOB - July 30, 1935, [id number]  Outpatient Primary MD for the patient is Street, [name], MD   Admit date - 05/14/2019   LOS - 1  No chief complaint on file.      Brief Narrative: Patient is a 83 y.o. highly functional female (still works) with PMHx of hypertension-presented with approximately 1 week history of fatigue and malaise-and 2-3-day history of worsening shortness of breath.  She is evaluated at [facility] health-she was positive for COVID-19-she was found to have severe hypoxemia requiring NRB in the setting of COVID-19 pneumonia.   Subjective:    Virginia Chan today feels essentially the same-still requiring around 15 L of high flow O2.   Assessment  & Plan :   Acute Hypoxic Resp Failure due to Covid 19 Viral pneumonia: Remains unchanged-on 15 L of high flow oxygen-but not in any distress-appears comfortable.  Will get plasma today-is s/p Actemra on 12/2.  Continue steroids and remdesivir.  Have encouraged patient to prone, and use incentive spirometry.  Monitor closely-if hypoxemia worsens or if she develops respiratory distress-she will need to be transferred to the ICU.  Fever: afebrile  O2 requirements:  SpO2: 100 % O2 Flow Rate (L/min): 15 L/min   COVID-19 Labs: Recent Labs    05/14/19 1755 05/15/19 0006  DDIMER 1.22* 0.97*  FERRITIN 1,177* 1,227*  CRP 19.2* 19.1*       Component Value Date/Time   BNP 103.9 (H) 05/14/2019 1755    No results for input(s): PROCALCITON in the last 168 hours.  No results found for: SARSCOV2NAA (positive at [place] on 12/2)  COVID-19 Medications: Steroids: 12/2>> Remdesivir:  12/2>> Actemra: 12/2 x 1 Convalescent Plasma: 12/3 x 1  Other medications: Diuretics:Euvolemic Antibiotics:Not needed as no evidence of bacterial infection CBG's stable on SSI while on steroid (A1c 6.7 on 12/2)  Prone/Incentive Spirometry: encouraged patient to lie prone for 3-4 hours at a time for a total of 16 hours a day, and to encourage incentive spirometry use 3-4/hour  DVT Prophylaxis  :  Heparin  AKI: Likely hemodynamically mediated-hold diuretics today-follow.  Should improve with supportive care.  HTN: Hold losartan due to mild AKI-start amlodipine-follow and adjust.  Consults  :  None  Procedures  :  None  ABG: No results found for: PHART, PCO2ART, PO2ART, HCO3, TCO2, ACIDBASEDEF, O2SAT  Vent Settings: N/A  Condition - Extremely Guarded  Family Communication  :  Son updated over the phone  Code Status :  Full Code  Diet :  Diet Order            Diet heart healthy/carb modified Room service appropriate? Yes; Fluid consistency: Thin  Diet effective now               Disposition Plan  :  Remain hospitalized  Barriers to discharge: Hypoxia requiring O2 supplementation/complete 5 days of IV Remdesivir  Antimicorbials  :    Anti-infectives (From admission, onward)   Start     Dose/Rate Route Frequency Ordered Stop   05/15/19 1000  remdesivir 100 mg in sodium chloride 0.9 % 250 mL IVPB     100 mg 500 mL/hr over 30 Minutes Intravenous Every 24 hours 05/14/19 1708 05/19/19 0959      Inpatient Medications  Scheduled Meds: . sodium chloride   Intravenous Once  . acetaminophen  650 mg Oral Once  . amLODipine  5 mg Oral Daily  . diphenhydrAMINE  25 mg Intravenous Once  . heparin injection (subcutaneous)  5,000 Units Subcutaneous Q8H  . insulin aspart  0-9 Units Subcutaneous TID WC  . mouth rinse  15 mL Mouth Rinse BID  . methylPREDNISolone (SOLU-MEDROL) injection  40 mg Intravenous Q12H  . vitamin C  500 mg Oral Daily  . zinc sulfate  220 mg Oral  Daily   Continuous Infusions: . remdesivir 100 mg in NS 100 mL 100 mg (05/15/19 0954)   PRN Meds:.acetaminophen, chlorpheniramine-HYDROcodone, guaiFENesin-dextromethorphan, hydrALAZINE, LORazepam, ondansetron **OR** ondansetron (ZOFRAN) IV, polyethylene glycol, traMADol   Time Spent in minutes 45  The patient is critically ill with multiple organ system failure and requires high complexity decision making for assessment and support, frequent evaluation and titration of therapies, advanced monitoring, review of radiographic studies and interpretation of complex data.  See all Orders from today for further details   Oren Binet M.D on 05/15/2019 at 4:35 PM  To page go to [email or web address] - use universal password  [facility] -  Office  [phone]    Objective:   Vitals:   05/15/19 1127 05/15/19 1149 05/15/19 1304 05/15/19 1500  BP: (!) 162/59 (!) 174/60 (!) 177/68 136/63  Pulse:  81  78  Resp: 20 19 20 19   Temp: 98.1 F (36.7 C) 97.6 F (36.4 C) (!) 97.1 F (36.2 C)   TempSrc: Oral Oral Oral   SpO2: 93%  93% 100%  Weight:      Height:        Wt Readings from Last 3 Encounters:  05/14/19 54.7 kg     Intake/Output Summary (Last 24 hours) at 05/15/2019 1635 Last data filed at 05/15/2019 1400 Gross per 24 hour  Intake 1292 ml  Output 150 ml  Net 1142 ml     Physical Exam Gen Exam:Alert awake-not in any distress HEENT:atraumatic, normocephalic Chest: B/L clear to auscultation anteriorly CVS:S1S2 regular Abdomen:soft non tender, non distended Extremities:no edema Neurology: Non focal Skin: no rash   Data Review:    CBC Recent Labs  Lab 05/14/19 1755 05/15/19 0006  WBC 7.6 10.3  HGB 12.7 12.8  HCT 37.8 38.7  PLT 231 282  MCV 95.5 95.3  MCH 32.1 31.5  MCHC 33.6 33.1  RDW 13.6 14.0  LYMPHSABS 1.3 1.0  MONOABS 0.5 0.8  EOSABS 0.0 0.0  BASOSABS 0.1 0.0    Chemistries  Recent Labs  Lab 05/14/19 1755 05/15/19 0006  NA 143 142  K 4.9  4.5  CL 112* 107  CO2 19* 22  GLUCOSE 222* 173*  BUN 44* 55*  CREATININE 1.43* 1.65*  CALCIUM 9.0 9.0  AST 62* 58*  ALT 23 21  ALKPHOS 100 99  BILITOT 0.5 0.7   ------------------------------------------------------------------------------------------------------------------ No results for input(s): CHOL, HDL, LDLCALC, TRIG, CHOLHDL, LDLDIRECT in the last 72 hours.  Lab Results  Component Value Date   HGBA1C 6.7 (H) 05/14/2019   ------------------------------------------------------------------------------------------------------------------ No results for input(s): TSH, T4TOTAL, T3FREE, THYROIDAB in the last 72 hours.  Invalid input(s): FREET3 ------------------------------------------------------------------------------------------------------------------ Recent Labs    05/14/19 1755 05/15/19 0006  FERRITIN 1,177* 1,227*    Coagulation profile No results for input(s): INR, PROTIME in the last 168 hours.  Recent Labs    05/14/19 1755 05/15/19 0006  DDIMER 1.22* 0.97*    Cardiac Enzymes No results for input(s): CKMB, TROPONINI, MYOGLOBIN in the last 168 hours.  Invalid input(s): CK ------------------------------------------------------------------------------------------------------------------    Component Value Date/Time   BNP 103.9 (H) 05/14/2019 1755    Micro Results No results found for this or any previous visit (from the past 240 hour(s)).  Radiology Reports No results found.

## 2019-05-15 NOTE — Evaluation (Signed)
Physical Therapy Evaluation Patient Details Name: Virginia Chan MRN: [id number] DOB: May 22, 1936 Today's Date: 05/15/2019   History of Present Illness  83 y/o w/ hx of HTN, presented w/ 1 week c/o fatigue/malaise developing into intermittent fever and myalgias. At Ed found to have profound hypoxia needing NRB, x-ray showed multifocal PNA, and + COVID test.  Clinical Impression   Pt admitted with above diagnosis. PTA was living home with spouse. Pt currently with functional limitations due to the deficits listed below (see PT Problem List). Pt is needing mod a with all functional mobility, she fatigues very quickly and desats to low 70s with minimal exertion. Pt was on 15L/min via HFNC. Pt will benefit from skilled PT to increase their independence and safety with mobility to allow discharge to the venue listed below. Should she meet requirements would be great candidate for post acute care level rehab at d/c.      Follow Up Recommendations SNF;CIR(if pt meets requirement would greatly benefit from CIR)    Equipment Recommendations  Rolling walker with 5" wheels    Recommendations for Other Services       Precautions / Restrictions Precautions Precautions: Fall Precaution Comments: desats easily, watch HR, HFNC Restrictions Weight Bearing Restrictions: No      Mobility  Bed Mobility Overal bed mobility: Needs Assistance Bed Mobility: Supine to Sit     Supine to sit: HOB elevated;Mod assist        Transfers Overall transfer level: Needs assistance Equipment used: 1 person hand held assist Transfers: Sit to/from [employer] Transfers Sit to Stand: Mod assist Stand pivot transfers: Mod assist          Ambulation/Gait             General Gait Details: did not take more than few steps to complete transfer bed to recliner, pt fatigued and desat to 70s with this on HFNC at [place] Mobility    Modified Rankin (Stroke Patients  Only)       Balance Overall balance assessment: Needs assistance Sitting-balance support: Feet supported Sitting balance-Leahy Scale: Fair   Postural control: Other (comment)(none noted) Standing balance support: During functional activity;Single extremity supported Standing balance-Leahy Scale: Fair                               Pertinent Vitals/Pain Pain Assessment: Faces Faces Pain Scale: Hurts little more Pain Location: w/ mobility noted to be grimmacing and saying "oh me, oh my" Pain Intervention(s): Limited activity within patient's tolerance    Home Living Family/patient expects to be discharged to:: Private residence Living Arrangements: Spouse/significant other Available Help at Discharge: Family Type of Home: House Home Access: Stairs to enter Entrance Stairs-Rails: Right Entrance Stairs-Number of Steps: 2 Home Layout: One level Home Equipment: Grab bars - tub/shower      Prior Function Level of Independence: Independent         Comments: 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[employer]), reports going to the gym daily, active and performing iADL     Hand Dominance        Extremity/Trunk Assessment   Upper Extremity Assessment Upper Extremity Assessment: Defer to OT evaluation    Lower Extremity Assessment Lower Extremity Assessment: Generalized weakness    Cervical / Trunk Assessment Cervical / Trunk Assessment: Kyphotic  Communication  Communication: No difficulties  Cognition Arousal/Alertness: Lethargic Behavior During Therapy: WFL for tasks assessed/performed Overall Cognitive Status: Within Functional Limits for tasks assessed                                        General Comments      Exercises Other Exercises Other Exercises: reinforced use of flutter valve and IS   Assessment/Plan    PT Assessment Patient needs continued PT services  PT Problem List  Decreased strength;Decreased activity tolerance;Decreased balance;Decreased mobility;Decreased coordination       PT Treatment Interventions Gait training;Functional mobility training;Therapeutic activities;Therapeutic exercise;Balance training;Neuromuscular re-education;Patient/family education    PT Goals (Current goals can be found in the Care Plan section)  Acute Rehab PT Goals Patient Stated Goal: go home PT Goal Formulation: With patient Time For Goal Achievement: 05/29/19 Potential to Achieve Goals: Fair    Frequency Min 3X/week   Barriers to discharge        Co-evaluation               AM-PAC PT "6 Clicks" Mobility  Outcome Measure Help needed turning from your back to your side while in a flat bed without using bedrails?: A Lot Help needed moving from lying on your back to sitting on the side of a flat bed without using bedrails?: A Lot Help needed moving to and from a bed to a chair (including a wheelchair)?: A Lot Help needed standing up from a chair using your arms (e.g., wheelchair or bedside chair)?: A Lot Help needed to walk in hospital room?: Total Help needed climbing 3-5 steps with a railing? : Total 6 Click Score: 10    End of Session Equipment Utilized During Treatment: Oxygen Activity Tolerance: Treatment limited secondary to medical complications (Comment);Patient limited by lethargy;Patient limited by fatigue Patient left: in chair;with call bell/phone within reach;with nursing/sitter in room Nurse Communication: Mobility status PT Visit Diagnosis: Other abnormalities of gait and mobility (R26.89);Muscle weakness (generalized) (M62.81)    Time: [id number] PT Time Calculation (min) (ACUTE ONLY): 14 min   Charges:   PT Evaluation $PT Eval Moderate Complexity: [place], PT   Delford Field 05/15/2019, 4:43 PM

## 2019-05-15 NOTE — Evaluation (Addendum)
Occupational Therapy Evaluation Patient Details Name: Virginia Chan MRN: [id number] DOB: [birth year] Today's Date: 05/15/2019    History of Present Illness 83 y/o w/ hx of HTN, presented w/ 1 week c/o fatigue/malaise developing into intermittent fever and myalgias. At Ed found to have profound hypoxia needing NRB, x-ray showed multifocal PNA, and + COVID test.   Clinical Impression   This 83 y/o female presents with the above. PTA pt reports very independent with ADL, iADL and functional mobility, was active and still working part time. Pt presenting with significant weakness, decreased activity tolerance and respiratory status. Pt tolerated sitting EOB >7 min during this session with close minguard-intermittent minA. Pt on 15L HFNC with O2 sats initially decreasing to 71% with completion of bed mobility, cued for deep breathing techniques and sats returning to mid-upper 80s within approx 1-2 min. Deferred OOB to recliner as pt increasingly fatigued with sitting EOB (increased tremors throughout as pt fatigues) and pt becoming tachy seated EOB, with HR up to the 140s and very briefly up to 177 (<2 sec). Pt will benefit from continued acute OT services - currently recommend post acute rehab services but will continue to follow/monitor for pt progress. Pt may be a great CIR candidate pending admission criteria met at time of discharge. Will follow.     Follow Up Recommendations  Supervision/Assistance - 24 hour;SNF;CIR(currently rec post acute rehab )    Equipment Recommendations  Tub/shower seat    Recommendations for Other Services       Precautions / Restrictions Precautions Precautions: Fall Precaution Comments: desats easily, watch HR, HFNC Restrictions Weight Bearing Restrictions: No      Mobility Bed Mobility Overal bed mobility: Needs Assistance Bed Mobility: Supine to Sit;Sit to Supine     Supine to sit: Min assist;HOB elevated Sit to supine: Mod assist;HOB elevated    General bed mobility comments: assist for trunk elevation to sit upright, assist for bil LEs due to fatigue  Transfers                 General transfer comment: deferred due to fatigue/pt tachy seated EOB - bed egressed to chair position     Balance Overall balance assessment: Needs assistance Sitting-balance support: Feet supported Sitting balance-Leahy Scale: Fair Sitting balance - Comments: heavily reliant on UE support due to weakness                                   ADL either performed or assessed with clinical judgement   ADL Overall ADL's : Needs assistance/impaired Eating/Feeding: Minimal assistance;Sitting Eating/Feeding Details (indicate cue type and reason): intermittent assist to support hand holding cup to drink - due to weakness/fatigue Grooming: Minimal assistance;Sitting   Upper Body Bathing: Minimal assistance;Sitting   Lower Body Bathing: Moderate assistance;Maximal assistance;Sitting/lateral leans   Upper Body Dressing : Minimal assistance;Sitting   Lower Body Dressing: Moderate assistance;Maximal assistance;Sitting/lateral leans                 General ADL Comments: pt with significant weakness/deconditioning and impaired respiratory status; pt tolerated sitting EOB >7 min with OT and while MD in room, pt increasingly fatigued seated EOB and became tachy (briefly up to 177) therefore returned to supine     Vision         Perception     Praxis      Pertinent Vitals/Pain Pain Assessment: No/denies pain     Hand Dominance  Extremity/Trunk Assessment Upper Extremity Assessment Upper Extremity Assessment: Generalized weakness;RUE deficits/detail;LUE deficits/detail RUE Deficits / Details: bil tremors with activity, worsen with fatigue RUE Coordination: decreased gross motor;decreased fine motor(due to weakness) LUE Deficits / Details: bil tremors with activity, worsen with fatigue LUE Coordination: decreased fine  motor;decreased gross motor(due to weakness)   Lower Extremity Assessment Lower Extremity Assessment: Defer to PT evaluation       Communication Communication Communication: No difficulties   Cognition Arousal/Alertness: Awake/alert;Lethargic Behavior During Therapy: WFL for tasks assessed/performed Overall Cognitive Status: Within Functional Limits for tasks assessed                                 General Comments: intermittently lethargic but arouses easily, very pleasant   General Comments       Exercises Exercises: Other exercises Other Exercises Other Exercises: instructed in use of flutter valve (x5) and IS   Shoulder Instructions      Home Living Family/patient expects to be discharged to:: Private residence Living Arrangements: Spouse/significant other Available Help at Discharge: Family Type of Home: House Home Access: Stairs to enter Technical brewer of Steps: 2 Entrance Stairs-Rails: Right Home Layout: One level     Bathroom Shower/Tub: Occupational psychologist: Standard     Home Equipment: Grab bars - tub/shower          Prior Functioning/Environment Level of Independence: Independent        Comments: 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[employer]), reports going to the gym daily, active and performing iADL        OT Problem List: Decreased strength;Decreased activity tolerance;Impaired balance (sitting and/or standing);Cardiopulmonary status limiting activity      OT Treatment/Interventions: Self-care/ADL training;Therapeutic exercise;Energy conservation;DME and/or AE instruction;Therapeutic activities;Patient/family education;Balance training    OT Goals(Current goals can be found in the care plan section) Acute Rehab OT Goals Patient Stated Goal: regain her independence OT Goal Formulation: With patient Time For Goal Achievement: 05/29/19 Potential to Achieve  Goals: Good  OT Frequency: Min 2X/week   Barriers to D/C:            Co-evaluation              AM-PAC OT "6 Clicks" Daily Activity     Outcome Measure Help from another person eating meals?: A Little Help from another person taking care of personal grooming?: A Little Help from another person toileting, which includes using toliet, bedpan, or urinal?: Total Help from another person bathing (including washing, rinsing, drying)?: A Lot Help from another person to put on and taking off regular upper body clothing?: A Lot Help from another person to put on and taking off regular lower body clothing?: Total 6 Click Score: 12   End of Session Equipment Utilized During Treatment: Oxygen Nurse Communication: Mobility status  Activity Tolerance: Patient tolerated treatment well Patient left: in bed;with call bell/phone within reach;with bed alarm set  OT Visit Diagnosis: Muscle weakness (generalized) (M62.81);Other abnormalities of gait and mobility (R26.89)                Time: 7902-4097 OT Time Calculation (min): 37 min Charges:  OT General Charges $OT Visit: 1 Visit OT Evaluation $OT Eval Moderate Complexity: 1 Mod OT Treatments $Self Care/Home Management : 8-22 mins  Lou Cal, OT Supplemental Rehabilitation Services Pager [phone] Office [place] 05/15/2019,  12:43 PM

## 2019-05-15 NOTE — Progress Notes (Signed)
Rehab Admissions Coordinator Note:  Patient was screened by Cleatrice Burke for appropriateness for an Inpatient Acute Rehab Consult per PT and OT recommendations.COVID positive 12/2. Per CIR guidelines pt must be 20 days from initial diagnosis with resolution of symptoms or two negatives tests before could admit to CIR post COVID.I will follow her progress.Cleatrice Burke RN MSN 05/15/2019, 7:54 PM  I can be reached at [phone].

## 2019-05-16 LAB — COMPREHENSIVE METABOLIC PANEL
ALT: 64 U/L — ABNORMAL HIGH (ref 0–44)
AST: 49 U/L — ABNORMAL HIGH (ref 15–41)
Albumin: 2.9 g/dL — ABNORMAL LOW (ref 3.5–5.0)
Alkaline Phosphatase: 98 U/L (ref 38–126)
Anion gap: 14 (ref 5–15)
BUN: 75 mg/dL — ABNORMAL HIGH (ref 8–23)
CO2: 19 mmol/L — ABNORMAL LOW (ref 22–32)
Calcium: 8.6 mg/dL — ABNORMAL LOW (ref 8.9–10.3)
Chloride: 104 mmol/L (ref 98–111)
Creatinine, Ser: 1.38 mg/dL — ABNORMAL HIGH (ref 0.44–1.00)
GFR calc Af Amer: 41 mL/min — ABNORMAL LOW (ref >60)
GFR calc non Af Amer: 36 mL/min — ABNORMAL LOW (ref >60)
Glucose, Bld: 200 mg/dL — ABNORMAL HIGH (ref 70–99)
Potassium: 4.3 mmol/L (ref 3.5–5.1)
Sodium: 137 mmol/L (ref 135–145)
Total Bilirubin: 0.5 mg/dL (ref 0.3–1.2)
Total Protein: 6.6 g/dL (ref 6.5–8.1)

## 2019-05-16 LAB — CBC WITH DIFFERENTIAL/PLATELET
Abs Immature Granulocytes: 1.21 10*3/uL — ABNORMAL HIGH (ref 0.00–0.07)
Basophils Absolute: 0.1 10*3/uL (ref 0.0–0.1)
Basophils Relative: 1 %
Eosinophils Absolute: 0 10*3/uL (ref 0.0–0.5)
Eosinophils Relative: 0 %
HCT: 33.7 % — ABNORMAL LOW (ref 36.0–46.0)
Hemoglobin: 11.3 g/dL — ABNORMAL LOW (ref 12.0–15.0)
Immature Granulocytes: 7 %
Lymphocytes Relative: 13 %
Lymphs Abs: 2.1 10*3/uL (ref 0.7–4.0)
MCH: 31.7 pg (ref 26.0–34.0)
MCHC: 33.5 g/dL (ref 30.0–36.0)
MCV: 94.4 fL (ref 80.0–100.0)
Monocytes Absolute: 1.1 10*3/uL — ABNORMAL HIGH (ref 0.1–1.0)
Monocytes Relative: 7 %
Neutro Abs: 12.3 10*3/uL — ABNORMAL HIGH (ref 1.7–7.7)
Neutrophils Relative %: 72 %
Platelets: 300 10*3/uL (ref 150–400)
RBC: 3.57 MIL/uL — ABNORMAL LOW (ref 3.87–5.11)
RDW: 13.9 % (ref 11.5–15.5)
WBC: 16.8 10*3/uL — ABNORMAL HIGH (ref 4.0–10.5)
nRBC: 0.1 % (ref 0.0–0.2)

## 2019-05-16 LAB — GLUCOSE, CAPILLARY
Glucose-Capillary: 169 mg/dL — ABNORMAL HIGH (ref 70–99)
Glucose-Capillary: 250 mg/dL — ABNORMAL HIGH (ref 70–99)
Glucose-Capillary: 214 mg/dL — ABNORMAL HIGH (ref 70–99)
Glucose-Capillary: 211 mg/dL — ABNORMAL HIGH (ref 70–99)

## 2019-05-16 LAB — BPAM FFP
Blood Product Expiration Date: 202012040123
ISSUE DATE / TIME: 202012031030
Unit Type and Rh: 5100

## 2019-05-16 LAB — C-REACTIVE PROTEIN: CRP: 9.4 mg/dL — ABNORMAL HIGH (ref <1.0)

## 2019-05-16 LAB — D-DIMER, QUANTITATIVE: D-Dimer, Quant: 1.69 ug/mL-FEU — ABNORMAL HIGH (ref 0.00–0.50)

## 2019-05-16 LAB — PREPARE FRESH FROZEN PLASMA

## 2019-05-16 LAB — FERRITIN: Ferritin: 1181 ng/mL — ABNORMAL HIGH (ref 11–307)

## 2019-05-16 MED ORDER — SODIUM CHLORIDE 0.9 % IV SOLN
100.0000 mg | Freq: Every day | INTRAVENOUS | Status: AC
  Administered 2019-05-17 – 2019-05-18 (×2): 100 mg via INTRAVENOUS
  Filled 2019-05-16 (×3): qty 20

## 2019-05-16 NOTE — Progress Notes (Addendum)
Occupational Therapy Treatment Patient Details Name: Virginia Chan MRN: [id number] DOB: 1935-08-05 Today's Date: 05/16/2019    History of present illness 83 y/o w/ hx of HTN, presented w/ 1 week c/o fatigue/malaise developing into intermittent fever and myalgias. At Ed found to have profound hypoxia needing NRB, x-ray showed multifocal PNA, and + COVID test.   OT comments  Pt making gradual progress towards OT goals. Tolerated OOB to chair with min-modA (HHA). Pt on 12L HFNC, post transfer SpO2 dropping into the high 60s, within approx 30 sec sats increased to 74%, returning to high 80s within approx 1 min. Pt with increased levels of anxiousness with decreased sats and hearing tele monitor alarm requiring cues for deep breathing/encouragement of relaxation techniques during recovery of sats. Pt performed grooming ADL seated in recliner though requiring rest breaks due to UE fatigue and SpO2 dropping to low 80s with seated activity, returned to >90% end of session. BP and HR stable throughout. Will continue per POC at this time.   Follow Up Recommendations  Supervision/Assistance - 24 hour;SNF;CIR(pending progress)    Equipment Recommendations  Tub/shower seat          Precautions / Restrictions Precautions Precautions: Fall Precaution Comments: desats easily, watch HR, HFNC Restrictions Weight Bearing Restrictions: No       Mobility Bed Mobility Overal bed mobility: Needs Assistance Bed Mobility: Supine to Sit     Supine to sit: HOB elevated;Min guard     General bed mobility comments: pt able to transition to EOB without physical assist today, minguard for safety/lines but no physical assist required  Transfers Overall transfer level: Needs assistance Equipment used: 1 person hand held assist Transfers: Sit to/from [facility] Sit to Stand: Min assist Stand pivot transfers: Min assist;Mod assist       General transfer comment: boosting and steadying  assist throughout, pt able to take few steps to turn towards recliner    Balance Overall balance assessment: Needs assistance Sitting-balance support: Feet supported Sitting balance-Leahy Scale: Fair     Standing balance support: During functional activity;Single extremity supported Standing balance-Leahy Scale: Poor Standing balance comment: reliant on external assist                           ADL either performed or assessed with clinical judgement   ADL Overall ADL's : Needs assistance/impaired     Grooming: Set up;Min guard;Sitting;Brushing hair Grooming Details (indicate cue type and reason): increased time/effort to perform, pt required rest breaks due to fatigue, O2 sats decreased with seated activity, lowest noted 77%, returning to high 80s within approx 1 min                              Functional mobility during ADLs: Minimal assistance;Moderate assistance(HHA, stand pivot transfer) General ADL Comments: pt tolerating OOB to recliner and seated grooming ADL, continues to have poor respiratory status and endurance, but small improvements noted from initial eval     Vision       Perception     Praxis      Cognition Arousal/Alertness: Awake/alert Behavior During Therapy: WFL for tasks assessed/performed;Anxious Overall Cognitive Status: Within Functional Limits for tasks assessed                                 General Comments: improved alertness from  initial eval, increasingly anxious when hearing alarm going off from tele monitor but able to redirect to reduce levels of anxiousness         Exercises Exercises: Other exercises Other Exercises Other Exercises: use of flutter valve x10 Other Exercises: reinforced use of IS Other Exercises: issued level 1 theraband and HEP but will need to review, deferred this session as pt already fatigued post grooming ADL   Shoulder Instructions       General Comments       Pertinent Vitals/ Pain       Pain Assessment: Faces Faces Pain Scale: Hurts a little bit Pain Location: generalized, with mobility Pain Descriptors / Indicators: Discomfort Pain Intervention(s): Monitored during session;Repositioned  Home Living                                          Prior Functioning/Environment              Frequency  Min 2X/week        Progress Toward Goals  OT Goals(current goals can now be found in the care plan section)  Progress towards OT goals: Progressing toward goals  Acute Rehab OT Goals Patient Stated Goal: go home OT Goal Formulation: With patient Time For Goal Achievement: 05/29/19 Potential to Achieve Goals: Good ADL Goals Pt Will Perform Grooming: sitting;with modified independence Pt Will Perform Lower Body Bathing: with supervision;sitting/lateral leans;sit to/from stand Pt Will Perform Lower Body Dressing: with supervision;sitting/lateral leans;sit to/from stand Pt Will Transfer to Toilet: with supervision;stand pivot transfer;ambulating Additional ADL Goal #1: Pt will engage in seated ADL EOB at supervision level with VSS throughout.  Plan Discharge plan remains appropriate    Co-evaluation                 AM-PAC OT "6 Clicks" Daily Activity     Outcome Measure   Help from another person eating meals?: A Little Help from another person taking care of personal grooming?: A Little Help from another person toileting, which includes using toliet, bedpan, or urinal?: A Lot Help from another person bathing (including washing, rinsing, drying)?: A Lot Help from another person to put on and taking off regular upper body clothing?: A Lot Help from another person to put on and taking off regular lower body clothing?: Total 6 Click Score: 13    End of Session Equipment Utilized During Treatment: Oxygen  OT Visit Diagnosis: Muscle weakness (generalized) (M62.81);Other abnormalities of gait and mobility  (R26.89)   Activity Tolerance Patient tolerated treatment well   Patient Left in chair;with call bell/phone within reach   Nurse Communication Mobility status        Time: [id number] OT Time Calculation (min): 30 min  Charges: OT General Charges $OT Visit: 1 Visit OT Treatments $Self Care/Home Management : 23-37 mins  Lou Cal, OT Supplemental Rehabilitation Services Pager [phone] Office [place] 05/16/2019, 1:52 PM

## 2019-05-16 NOTE — Plan of Care (Signed)
  Problem: Education: Goal: Knowledge of risk factors and measures for prevention of condition will improve Outcome: Progressing   Problem: Coping: Goal: Psychosocial and spiritual needs will be supported Outcome: Progressing   Problem: Respiratory: Goal: Will maintain a patent airway Outcome: Progressing Goal: Complications related to the disease process, condition or treatment will be avoided or minimized Outcome: Progressing   

## 2019-05-16 NOTE — Progress Notes (Addendum)
PROGRESS NOTE                                                                                                                                                                                                             Patient Demographics:    Virginia Chan, is a 83 y.o. female, DOB - [birth year], ZN:[id number]  Outpatient Primary MD for the patient is Street, [name], MD   Admit date - 05/14/2019   LOS - 2  No chief complaint on file.      Brief Narrative: Patient is a 83 y.o. highly functional female (still works) with PMHx of hypertension-presented with approximately 1 week history of fatigue and malaise-and 2-3-day history of worsening shortness of breath.  She is evaluated at [facility]-she was positive for COVID-19-she was found to have severe hypoxemia requiring NRB in the setting of COVID-19 pneumonia.   Subjective:    [name] today feels better-she appears more comfortable-while I was in the room-I was able to titrate her down to 10-12 L of high flow oxygen (has been on 15 L since admission)   Assessment  & Plan :   Acute Hypoxic Resp Failure due to Covid 19 Viral pneumonia: Seems to have improved somewhat-oxygen requirements have started to come down.  Continue steroids/remdesivir.  Patient is s/p Actemra and convalescent plasma.  Continue close monitoring.  Have encouraged her to use incentive spirometry and be prone for at least 15-16 hours a day.    Fever: afebrile  O2 requirements:  SpO2: 94 % O2 Flow Rate (L/min): 12 L/min   COVID-19 Labs: Recent Labs    05/14/19 1755 05/15/19 0006 05/16/19 0553  DDIMER 1.22* 0.97* 1.69*  FERRITIN 1,177* 1,227* 1,181*  CRP 19.2* 19.1* 9.4*       Component Value Date/Time   BNP 103.9 (H) 05/14/2019 1755    No results for input(s): PROCALCITON in the last 168 hours.  No results found for: SARSCOV2NAA (positive at [place] on 12/2)  COVID-19  Medications: Steroids: 12/2>> Remdesivir: 12/2>> Actemra: 12/2 x 1 Convalescent Plasma: 12/3 x 1  Other medications: Diuretics:Euvolemic Antibiotics:Not needed as no evidence of bacterial infection CBG's stable on SSI while on steroid (A1c 6.7 on 12/2)  Prone/Incentive Spirometry: encouraged patient to lie prone for 3-4 hours at a time for a total of 16 hours a day,  and to encourage incentive spirometry use 3-4/hour  DVT Prophylaxis  :   Heparin  AKI: Likely hemodynamically mediated-hold diuretics today-follow.  Should improve with supportive care.  HTN: Controlled with amlodipine-continue to hold losartan. .  Consults  :  None  Procedures  :  None  ABG: No results found for: PHART, PCO2ART, PO2ART, HCO3, TCO2, ACIDBASEDEF, O2SAT  Vent Settings: N/A  Condition - Extremely Guarded  Family Communication  :  Son updated over the phone 12/4 (spouse wanted me to call son-as he understands things better)  Code Status :  Full Code  Diet :  Diet Order            Diet heart healthy/carb modified Room service appropriate? Yes; Fluid consistency: Thin  Diet effective now               Disposition Plan  :  Remain hospitalized  Barriers to discharge: Hypoxia requiring O2 supplementation/complete 5 days of IV Remdesivir  Antimicorbials  :    Anti-infectives (From admission, onward)   Start     Dose/Rate Route Frequency Ordered Stop   05/15/19 1000  remdesivir 100 mg in sodium chloride 0.9 % 250 mL IVPB     100 mg 500 mL/hr over 30 Minutes Intravenous Every 24 hours 05/14/19 1708 05/19/19 0959      Inpatient Medications  Scheduled Meds: . sodium chloride   Intravenous Once  . acetaminophen  650 mg Oral Once  . amLODipine  5 mg Oral Daily  . diphenhydrAMINE  25 mg Intravenous Once  . heparin injection (subcutaneous)  7,500 Units Subcutaneous Q8H  . insulin aspart  0-9 Units Subcutaneous TID WC  . mouth rinse  15 mL Mouth Rinse BID  . methylPREDNISolone  (SOLU-MEDROL) injection  40 mg Intravenous Q12H  . vitamin C  500 mg Oral Daily  . zinc sulfate  220 mg Oral Daily   Continuous Infusions: . remdesivir 100 mg in NS 100 mL 100 mg (05/16/19 0925)   PRN Meds:.acetaminophen, chlorpheniramine-HYDROcodone, guaiFENesin-dextromethorphan, hydrALAZINE, LORazepam, ondansetron **OR** ondansetron (ZOFRAN) IV, polyethylene glycol, traMADol   Time Spent in minutes 35  See all Orders from today for further details   Oren Binet M.D on 05/16/2019 at 1:43 PM  To page go to [email or web address] - use universal password  [facility] -  Office  [phone]    Objective:   Vitals:   05/16/19 0512 05/16/19 0705 05/16/19 0920 05/16/19 1133  BP: (!) 140/52 (!) 148/60  (!) 144/53  Pulse: 75 82  82  Resp: 20   (!) 24  Temp: 98.5 F (36.9 C) 98.4 F (36.9 C)  98.7 F (37.1 C)  TempSrc: Oral Oral  Oral  SpO2: 92% 91% 95% 94%  Weight:      Height:        Wt Readings from Last 3 Encounters:  05/14/19 54.7 kg     Intake/Output Summary (Last 24 hours) at 05/16/2019 1343 Last data filed at 05/16/2019 0929 Gross per 24 hour  Intake 530 ml  Output 1200 ml  Net -670 ml     Physical Exam Gen Exam:Alert awake-not in any distress HEENT:atraumatic, normocephalic Chest: B/L clear to auscultation anteriorly CVS:S1S2 regular Abdomen:soft non tender, non distended Extremities:no edema Neurology: Non focal Skin: no rash   Data Review:    CBC Recent Labs  Lab 05/14/19 1755 05/15/19 0006 05/16/19 0553  WBC 7.6 10.3 16.8*  HGB 12.7 12.8 11.3*  HCT 37.8 38.7 33.7*  PLT 231 282 300  MCV  95.5 95.3 94.4  MCH 32.1 31.5 31.7  MCHC 33.6 33.1 33.5  RDW 13.6 14.0 13.9  LYMPHSABS 1.3 1.0 2.1  MONOABS 0.5 0.8 1.1*  EOSABS 0.0 0.0 0.0  BASOSABS 0.1 0.0 0.1    Chemistries  Recent Labs  Lab 05/14/19 1755 05/15/19 0006 05/16/19 0553  NA 143 142 137  K 4.9 4.5 4.3  CL 112* 107 104  CO2 19* 22 19*  GLUCOSE 222* 173* 200*  BUN 44* 55*  75*  CREATININE 1.43* 1.65* 1.38*  CALCIUM 9.0 9.0 8.6*  AST 62* 58* 49*  ALT 23 21 64*  ALKPHOS 100 99 98  BILITOT 0.5 0.7 0.5   ------------------------------------------------------------------------------------------------------------------ No results for input(s): CHOL, HDL, LDLCALC, TRIG, CHOLHDL, LDLDIRECT in the last 72 hours.  Lab Results  Component Value Date   HGBA1C 6.7 (H) 05/14/2019   ------------------------------------------------------------------------------------------------------------------ No results for input(s): TSH, T4TOTAL, T3FREE, THYROIDAB in the last 72 hours.  Invalid input(s): FREET3 ------------------------------------------------------------------------------------------------------------------ Recent Labs    05/15/19 0006 05/16/19 0553  FERRITIN 1,227* 1,181*    Coagulation profile No results for input(s): INR, PROTIME in the last 168 hours.  Recent Labs    05/15/19 0006 05/16/19 0553  DDIMER 0.97* 1.69*    Cardiac Enzymes No results for input(s): CKMB, TROPONINI, MYOGLOBIN in the last 168 hours.  Invalid input(s): CK ------------------------------------------------------------------------------------------------------------------    Component Value Date/Time   BNP 103.9 (H) 05/14/2019 1755    Micro Results No results found for this or any previous visit (from the past 240 hour(s)).  Radiology Reports No results found.

## 2019-05-16 NOTE — Progress Notes (Addendum)
Chaplain consulted with Ms. Catherman via phone in response to [place] and referral from [place].   Ms Whetsell noted that she had filled out Advance Directive at home, but did not have this notarized.  She wonders about options for completing advance directive at [place].     Ms Nicholl states she wishes her spouse and son to be HCPOA.  As these are her next-of-kin, they are her default HCPOA in [place].   Regarding Living Will, Ms Krengel notes "I want to be kept comfortable and not have heroic measures."   Spoke with patient about limitation of not being able to notarize this due to lack of witnesses.   I raised possibility of MOST form conversation with physician, as we would not need witnesses for this.    I wonder if care team can document Ms Rheinheimer' wishes on MOST form  Entered this information in [place] tab under Advance Care Planning.     Thank you for consult.   Jerene Pitch, MDiv, [facility]  Lead Clinical Chaplain  [place], [facility]  [phone]

## 2019-05-17 ENCOUNTER — Inpatient Hospital Stay (HOSPITAL_COMMUNITY): Payer: PPO

## 2019-05-17 DIAGNOSIS — J9601 Acute respiratory failure with hypoxia: Secondary | ICD-10-CM

## 2019-05-17 LAB — CBC WITH DIFFERENTIAL/PLATELET
Abs Immature Granulocytes: 1.28 10*3/uL — ABNORMAL HIGH (ref 0.00–0.07)
Basophils Absolute: 0.1 10*3/uL (ref 0.0–0.1)
Basophils Relative: 1 %
Eosinophils Absolute: 0 10*3/uL (ref 0.0–0.5)
Eosinophils Relative: 0 %
HCT: 35.2 % — ABNORMAL LOW (ref 36.0–46.0)
Hemoglobin: 11.7 g/dL — ABNORMAL LOW (ref 12.0–15.0)
Immature Granulocytes: 7 %
Lymphocytes Relative: 11 %
Lymphs Abs: 2.1 10*3/uL (ref 0.7–4.0)
MCH: 31.7 pg (ref 26.0–34.0)
MCHC: 33.2 g/dL (ref 30.0–36.0)
MCV: 95.4 fL (ref 80.0–100.0)
Monocytes Absolute: 1.2 10*3/uL — ABNORMAL HIGH (ref 0.1–1.0)
Monocytes Relative: 6 %
Neutro Abs: 14.7 10*3/uL — ABNORMAL HIGH (ref 1.7–7.7)
Neutrophils Relative %: 75 %
Platelets: 356 10*3/uL (ref 150–400)
RBC: 3.69 MIL/uL — ABNORMAL LOW (ref 3.87–5.11)
RDW: 13.7 % (ref 11.5–15.5)
WBC: 19.4 10*3/uL — ABNORMAL HIGH (ref 4.0–10.5)
nRBC: 0.3 % — ABNORMAL HIGH (ref 0.0–0.2)

## 2019-05-17 LAB — COMPREHENSIVE METABOLIC PANEL
ALT: 23 U/L (ref 0–44)
AST: 44 U/L — ABNORMAL HIGH (ref 15–41)
Albumin: 2.9 g/dL — ABNORMAL LOW (ref 3.5–5.0)
Alkaline Phosphatase: 114 U/L (ref 38–126)
Anion gap: 13 (ref 5–15)
BUN: 65 mg/dL — ABNORMAL HIGH (ref 8–23)
CO2: 22 mmol/L (ref 22–32)
Calcium: 9 mg/dL (ref 8.9–10.3)
Chloride: 105 mmol/L (ref 98–111)
Creatinine, Ser: 1.19 mg/dL — ABNORMAL HIGH (ref 0.44–1.00)
GFR calc Af Amer: 50 mL/min — ABNORMAL LOW (ref >60)
GFR calc non Af Amer: 43 mL/min — ABNORMAL LOW (ref >60)
Glucose, Bld: 201 mg/dL — ABNORMAL HIGH (ref 70–99)
Potassium: 5.1 mmol/L (ref 3.5–5.1)
Sodium: 140 mmol/L (ref 135–145)
Total Bilirubin: 0.7 mg/dL (ref 0.3–1.2)
Total Protein: 6.6 g/dL (ref 6.5–8.1)

## 2019-05-17 LAB — GLUCOSE, CAPILLARY
Glucose-Capillary: 194 mg/dL — ABNORMAL HIGH (ref 70–99)
Glucose-Capillary: 170 mg/dL — ABNORMAL HIGH (ref 70–99)
Glucose-Capillary: 228 mg/dL — ABNORMAL HIGH (ref 70–99)
Glucose-Capillary: 255 mg/dL — ABNORMAL HIGH (ref 70–99)
Glucose-Capillary: 246 mg/dL — ABNORMAL HIGH (ref 70–99)

## 2019-05-17 LAB — C-REACTIVE PROTEIN: CRP: 5.9 mg/dL — ABNORMAL HIGH (ref <1.0)

## 2019-05-17 LAB — PROCALCITONIN: Procalcitonin: 0.1 ng/mL

## 2019-05-17 LAB — D-DIMER, QUANTITATIVE: D-Dimer, Quant: 1.81 ug/mL-FEU — ABNORMAL HIGH (ref 0.00–0.50)

## 2019-05-17 LAB — FERRITIN: Ferritin: 1255 ng/mL — ABNORMAL HIGH (ref 11–307)

## 2019-05-17 LAB — BRAIN NATRIURETIC PEPTIDE: B Natriuretic Peptide: 52.8 pg/mL (ref 0.0–100.0)

## 2019-05-17 MED ORDER — GABAPENTIN 600 MG PO TABS
300.0000 mg | ORAL_TABLET | Freq: Two times a day (BID) | ORAL | Status: DC
  Administered 2019-05-17: 300 mg via ORAL
  Filled 2019-05-17 (×3): qty 0.5

## 2019-05-17 MED ORDER — METHYLPREDNISOLONE SODIUM SUCC 40 MG IJ SOLR
40.0000 mg | Freq: Every day | INTRAMUSCULAR | Status: DC
  Administered 2019-05-17 – 2019-05-20 (×4): 40 mg via INTRAVENOUS
  Filled 2019-05-17 (×4): qty 1

## 2019-05-17 MED ORDER — FUROSEMIDE 10 MG/ML IJ SOLN
40.0000 mg | Freq: Once | INTRAMUSCULAR | Status: AC
  Administered 2019-05-17: 40 mg via INTRAVENOUS
  Filled 2019-05-17: qty 4

## 2019-05-17 MED ORDER — HYDRALAZINE HCL 25 MG PO TABS
50.0000 mg | ORAL_TABLET | Freq: Three times a day (TID) | ORAL | Status: DC
  Administered 2019-05-17 – 2019-05-24 (×21): 50 mg via ORAL
  Filled 2019-05-17 (×3): qty 1
  Filled 2019-05-17: qty 2
  Filled 2019-05-17 (×18): qty 1

## 2019-05-17 MED ORDER — GABAPENTIN 300 MG PO CAPS
300.0000 mg | ORAL_CAPSULE | Freq: Two times a day (BID) | ORAL | Status: DC
  Administered 2019-05-17 – 2019-05-26 (×18): 300 mg via ORAL
  Filled 2019-05-17 (×19): qty 1

## 2019-05-17 MED ORDER — PANTOPRAZOLE SODIUM 40 MG PO TBEC
40.0000 mg | DELAYED_RELEASE_TABLET | Freq: Every day | ORAL | Status: DC
  Administered 2019-05-17 – 2019-05-26 (×10): 40 mg via ORAL
  Filled 2019-05-17 (×10): qty 1

## 2019-05-17 MED ORDER — VITAMIN E 45 MG (100 UNIT) PO CAPS
1000.0000 [IU] | ORAL_CAPSULE | Freq: Every day | ORAL | Status: DC
  Administered 2019-05-17 – 2019-05-26 (×10): 1000 [IU] via ORAL
  Filled 2019-05-17 (×10): qty 2

## 2019-05-17 MED ORDER — SALINE SPRAY 0.65 % NA SOLN
1.0000 | NASAL | Status: DC | PRN
  Filled 2019-05-17 (×2): qty 44

## 2019-05-17 NOTE — Progress Notes (Signed)
Son [name]) called in updated on patient status.

## 2019-05-17 NOTE — Plan of Care (Signed)
  Problem: Education: Goal: Knowledge of risk factors and measures for prevention of condition will improve Outcome: Progressing   Problem: Coping: Goal: Psychosocial and spiritual needs will be supported Outcome: Progressing   Problem: Respiratory: Goal: Will maintain a patent airway Outcome: Progressing Goal: Complications related to the disease process, condition or treatment will be avoided or minimized Outcome: Progressing   

## 2019-05-17 NOTE — Progress Notes (Signed)
PROGRESS NOTE                                                                                                                                                                                                             Patient Demographics:    Virginia Chan, is a 83 y.o. female, DOB - [birth year], XYB:[id number]  Outpatient Primary MD for the patient is Street, [name], MD    LOS - 3  Admit date - 05/14/2019    CC - SOB     Brief Narrative  Patient is a 83 y.o. highly functional female (still works) with PMHx of hypertension-presented with approximately 1 week history of fatigue and malaise-and 2-3-day history of worsening shortness of breath.  She is evaluated at [facility]-she was positive for COVID-19-she was found to have severe hypoxemia requiring NRB in the setting of COVID-19 pneumonia.   Subjective:    Virginia Chan today has, No headache, No chest pain, No abdominal pain - No Nausea, No new weakness tingling or numbness, +ve but better Cough - SOB.    Assessment  & Plan :     1. Acute Hypoxic Resp. Failure due to Acute Covid 19 Viral Pneumonitis during the ongoing 2020 Covid 19 Pandemic - she had severe disease and required 12 to 15 L of nasal cannula oxygen, she has been started on IV steroids and remdesivir, also received convalescent plasma and Actemra.  Gradual improvement.  Due to leukocytosis will repeat chest x-ray.  So far mild to moderate clinical improvement.  Start tapering IV steroids.  Encouraged her to sit up in chair in the daytime use I-S and flutter valve for pulmonary toiletry and then prone in bed when at night.  SpO2: 92 % O2 Flow Rate (L/min): 14 L/min  Recent Labs  Lab 05/14/19 1755 05/15/19 0006 05/16/19 0553 05/17/19 0229  CRP 19.2* 19.1* 9.4* 5.9*  DDIMER 1.22* 0.97* 1.69* 1.81*  FERRITIN 1,177* 1,227* 1,181* 1,255*  BNP 103.9*  --   --  52.8  PROCALCITON  --   --   --  0.10     Hepatic Function Latest Ref Rng & Units 05/17/2019 05/16/2019 05/15/2019  Total Protein 6.5 - 8.1 g/dL 6.6 6.6 7.4  Albumin 3.5 - 5.0 g/dL 2.9(L) 2.9(L) 3.2(L)  AST 15 - 41 U/L 44(H) 49(H) 58(H)  ALT 0 -  44 U/L 23 64(H) 21  Alk Phosphatase 38 - 126 U/L 114 98 99  Total Bilirubin 0.3 - 1.2 mg/dL 0.7 0.5 0.7      2.  Leukocytosis.  Likely steroid related, repeat chest x-ray, monitor clinically.  Afebrile with stable procalcitonin.  3.  Minimally elevated LFTs.  Likely due to COVID-19.  Symptom-free trend.  4.  Essential hypertension.  Stable on Norvasc, will add low-dose oral hydralazine for better control.  5.  Few crackles on exam.  Gentle Lasix x1 on 05/17/2019 and monitor.    Condition - Extremely Guarded  Family Communication  :  husband  Code Status :  Full  Diet :   Diet Order            Diet heart healthy/carb modified Room service appropriate? Yes; Fluid consistency: Thin  Diet effective now               Disposition Plan  :  Home once better  Consults  :  None  Procedures  :     PUD Prophylaxis : PPI  DVT Prophylaxis  :    Heparin    Lab Results  Component Value Date   PLT 356 05/17/2019    Inpatient Medications  Scheduled Meds: . sodium chloride   Intravenous Once  . acetaminophen  650 mg Oral Once  . amLODipine  5 mg Oral Daily  . diphenhydrAMINE  25 mg Intravenous Once  . furosemide  40 mg Intravenous Once  . gabapentin  300 mg Oral BID  . heparin injection (subcutaneous)  7,500 Units Subcutaneous Q8H  . insulin aspart  0-9 Units Subcutaneous TID WC  . mouth rinse  15 mL Mouth Rinse BID  . methylPREDNISolone (SOLU-MEDROL) injection  40 mg Intravenous Daily  . pantoprazole  40 mg Oral Daily  . vitamin C  500 mg Oral Daily  . vitamin E  1,000 Units Oral Daily  . zinc sulfate  220 mg Oral Daily   Continuous Infusions: . remdesivir 100 mg in NS 100 mL     PRN Meds:.acetaminophen, chlorpheniramine-HYDROcodone, guaiFENesin-dextromethorphan,  hydrALAZINE, LORazepam, [DISCONTINUED] ondansetron **OR** ondansetron (ZOFRAN) IV, polyethylene glycol, traMADol  Antibiotics  :    Anti-infectives (From admission, onward)   Start     Dose/Rate Route Frequency Ordered Stop   05/17/19 1000  remdesivir 100 mg in sodium chloride 0.9 % 100 mL IVPB     100 mg 200 mL/hr over 30 Minutes Intravenous Daily 05/16/19 2124 05/19/19 0959   05/15/19 1000  remdesivir 100 mg in sodium chloride 0.9 % 250 mL IVPB  Status:  Discontinued     100 mg 500 mL/hr over 30 Minutes Intravenous Every 24 hours 05/14/19 1708 05/16/19 2123       Time Spent in minutes  30   Lala Lund M.D on 05/17/2019 at 9:59 AM  To page go to [email or web address] - password [email or web address]  [facility] -  Office  [phone]   See all Orders from today for further details    Objective:   Vitals:   05/16/19 2035 05/17/19 0042 05/17/19 0421 05/17/19 0741  BP: (!) 143/55 (!) 147/59 (!) 147/58 (!) 149/63  Pulse: 85 78  77  Resp: (!) 24 (!) 27 (!) 21 17  Temp: 98.5 F (36.9 C) 98.2 F (36.8 C) 98.6 F (37 C) 98.4 F (36.9 C)  TempSrc: Oral Oral Oral Oral  SpO2: 92% 95% 92% 92%  Weight:      Height:  Wt Readings from Last 3 Encounters:  05/14/19 54.7 kg     Intake/Output Summary (Last 24 hours) at 05/17/2019 0959 Last data filed at 05/17/2019 0400 Gross per 24 hour  Intake 210 ml  Output -  Net 210 ml   Physical Exam  Awake Alert, Oriented X 3, No new F.N deficits, Normal affect [place].AT,PERRAL Supple Neck,No JVD, No cervical lymphadenopathy appriciated.  Symmetrical Chest wall movement, Good air movement bilaterally, few rales RRR,No Gallops,Rubs or new Murmurs, No Parasternal Heave +ve B.Sounds, Abd Soft, No tenderness, No organomegaly appriciated, No rebound - guarding or rigidity. No Cyanosis, Clubbing or edema, No new Rash or bruise       Data Review:    CBC Recent Labs  Lab 05/14/19 1755 05/15/19 0006 05/16/19 0553 05/17/19 0229  WBC 7.6  10.3 16.8* 19.4*  HGB 12.7 12.8 11.3* 11.7*  HCT 37.8 38.7 33.7* 35.2*  PLT 231 282 300 356  MCV 95.5 95.3 94.4 95.4  MCH 32.1 31.5 31.7 31.7  MCHC 33.6 33.1 33.5 33.2  RDW 13.6 14.0 13.9 13.7  LYMPHSABS 1.3 1.0 2.1 2.1  MONOABS 0.5 0.8 1.1* 1.2*  EOSABS 0.0 0.0 0.0 0.0  BASOSABS 0.1 0.0 0.1 0.1    Chemistries  Recent Labs  Lab 05/14/19 1755 05/15/19 0006 05/16/19 0553 05/17/19 0229  NA 143 142 137 140  K 4.9 4.5 4.3 5.1  CL 112* 107 104 105  CO2 19* 22 19* 22  GLUCOSE 222* 173* 200* 201*  BUN 44* 55* 75* 65*  CREATININE 1.43* 1.65* 1.38* 1.19*  CALCIUM 9.0 9.0 8.6* 9.0  AST 62* 58* 49* 44*  ALT 23 21 64* 23  ALKPHOS 100 99 98 114  BILITOT 0.5 0.7 0.5 0.7   ------------------------------------------------------------------------------------------------------------------ No results for input(s): CHOL, HDL, LDLCALC, TRIG, CHOLHDL, LDLDIRECT in the last 72 hours.  Lab Results  Component Value Date   HGBA1C 6.7 (H) 05/14/2019   ------------------------------------------------------------------------------------------------------------------ No results for input(s): TSH, T4TOTAL, T3FREE, THYROIDAB in the last 72 hours.  Invalid input(s): FREET3  Cardiac Enzymes No results for input(s): CKMB, TROPONINI, MYOGLOBIN in the last 168 hours.  Invalid input(s): CK ------------------------------------------------------------------------------------------------------------------    Component Value Date/Time   BNP 52.8 05/17/2019 0229    Micro Results No results found for this or any previous visit (from the past 240 hour(s)).  Radiology Reports No results found.

## 2019-05-17 NOTE — Progress Notes (Signed)
Pt moved to chair for breakfast this morning, desatted to low 70's and is currently on 14L HFNC. Pt exhibits SOB with cough. Pt now at 88%. MD informed. Will continue to monitor patient. Pt denies any distress at this time.

## 2019-05-18 LAB — GLUCOSE, CAPILLARY
Glucose-Capillary: 132 mg/dL — ABNORMAL HIGH (ref 70–99)
Glucose-Capillary: 234 mg/dL — ABNORMAL HIGH (ref 70–99)
Glucose-Capillary: 290 mg/dL — ABNORMAL HIGH (ref 70–99)

## 2019-05-18 LAB — CBC WITH DIFFERENTIAL/PLATELET
Abs Immature Granulocytes: 1.39 10*3/uL — ABNORMAL HIGH (ref 0.00–0.07)
Basophils Absolute: 0.2 10*3/uL — ABNORMAL HIGH (ref 0.0–0.1)
Basophils Relative: 1 %
Eosinophils Absolute: 0 10*3/uL (ref 0.0–0.5)
Eosinophils Relative: 0 %
HCT: 35.8 % — ABNORMAL LOW (ref 36.0–46.0)
Hemoglobin: 12 g/dL (ref 12.0–15.0)
Immature Granulocytes: 7 %
Lymphocytes Relative: 11 %
Lymphs Abs: 2.2 10*3/uL (ref 0.7–4.0)
MCH: 31.7 pg (ref 26.0–34.0)
MCHC: 33.5 g/dL (ref 30.0–36.0)
MCV: 94.5 fL (ref 80.0–100.0)
Monocytes Absolute: 1.3 10*3/uL — ABNORMAL HIGH (ref 0.1–1.0)
Monocytes Relative: 6 %
Neutro Abs: 15.9 10*3/uL — ABNORMAL HIGH (ref 1.7–7.7)
Neutrophils Relative %: 75 %
Platelets: 376 10*3/uL (ref 150–400)
RBC: 3.79 MIL/uL — ABNORMAL LOW (ref 3.87–5.11)
RDW: 13.4 % (ref 11.5–15.5)
WBC: 21.1 10*3/uL — ABNORMAL HIGH (ref 4.0–10.5)
nRBC: 0.2 % (ref 0.0–0.2)

## 2019-05-18 LAB — COMPREHENSIVE METABOLIC PANEL
ALT: 23 U/L (ref 0–44)
AST: 32 U/L (ref 15–41)
Albumin: 3 g/dL — ABNORMAL LOW (ref 3.5–5.0)
Alkaline Phosphatase: 109 U/L (ref 38–126)
Anion gap: 13 (ref 5–15)
BUN: 71 mg/dL — ABNORMAL HIGH (ref 8–23)
CO2: 24 mmol/L (ref 22–32)
Calcium: 9 mg/dL (ref 8.9–10.3)
Chloride: 100 mmol/L (ref 98–111)
Creatinine, Ser: 1.45 mg/dL — ABNORMAL HIGH (ref 0.44–1.00)
GFR calc Af Amer: 39 mL/min — ABNORMAL LOW (ref >60)
GFR calc non Af Amer: 34 mL/min — ABNORMAL LOW (ref >60)
Glucose, Bld: 168 mg/dL — ABNORMAL HIGH (ref 70–99)
Potassium: 5.3 mmol/L — ABNORMAL HIGH (ref 3.5–5.1)
Sodium: 137 mmol/L (ref 135–145)
Total Bilirubin: 1.1 mg/dL (ref 0.3–1.2)
Total Protein: 6.4 g/dL — ABNORMAL LOW (ref 6.5–8.1)

## 2019-05-18 LAB — BRAIN NATRIURETIC PEPTIDE: B Natriuretic Peptide: 68.9 pg/mL (ref 0.0–100.0)

## 2019-05-18 LAB — URINALYSIS, ROUTINE W REFLEX MICROSCOPIC
Bilirubin Urine: NEGATIVE
Glucose, UA: NEGATIVE mg/dL
Hgb urine dipstick: NEGATIVE
Ketones, ur: NEGATIVE mg/dL
Leukocytes,Ua: NEGATIVE
Nitrite: NEGATIVE
Protein, ur: NEGATIVE mg/dL
Specific Gravity, Urine: 1.011 (ref 1.005–1.030)
pH: 5 (ref 5.0–8.0)

## 2019-05-18 LAB — PROCALCITONIN: Procalcitonin: <0.1 ng/mL

## 2019-05-18 LAB — C-REACTIVE PROTEIN: CRP: 3.6 mg/dL — ABNORMAL HIGH (ref <1.0)

## 2019-05-18 LAB — FERRITIN: Ferritin: 846 ng/mL — ABNORMAL HIGH (ref 11–307)

## 2019-05-18 LAB — MAGNESIUM: Magnesium: 2.2 mg/dL (ref 1.7–2.4)

## 2019-05-18 LAB — D-DIMER, QUANTITATIVE: D-Dimer, Quant: 1.59 ug/mL-FEU — ABNORMAL HIGH (ref 0.00–0.50)

## 2019-05-18 MED ORDER — FUROSEMIDE 10 MG/ML IJ SOLN
60.0000 mg | Freq: Once | INTRAMUSCULAR | Status: AC
  Administered 2019-05-18: 60 mg via INTRAVENOUS
  Filled 2019-05-18: qty 6

## 2019-05-18 MED ORDER — SODIUM POLYSTYRENE SULFONATE 15 GM/60ML PO SUSP
30.0000 g | Freq: Once | ORAL | Status: AC
  Administered 2019-05-18: 30 g via ORAL
  Filled 2019-05-18: qty 120

## 2019-05-18 NOTE — Progress Notes (Signed)
PROGRESS NOTE                                                                                                                                                                                                             Patient Demographics:    Virginia Chan, is a 83 y.o. female, DOB - [birth year], YNW:[id number]  Outpatient Primary MD for the patient is Street, [name], MD    LOS - 4  Admit date - 05/14/2019    CC - SOB     Brief Narrative  Patient is a 83 y.o. highly functional female (still works) with PMHx of hypertension-presented with approximately 1 week history of fatigue and malaise-and 2-3-day history of worsening shortness of breath.  She is evaluated at [facility]-she was positive for COVID-19-she was found to have severe hypoxemia requiring NRB in the setting of COVID-19 pneumonia.   Subjective:   Patient in bed denies any headache or chest pain, no abdominal pain.  Shortness of breath is improved but is still short of breath on exertion.   Assessment  & Plan :     1. Acute Hypoxic Resp. Failure due to Acute Covid 19 Viral Pneumonitis during the ongoing 2020 Covid 19 Pandemic - she had severe disease and is still requiring 15 L nasal cannula oxygen, she has been started on IV steroids and remdesivir, also received convalescent plasma and Actemra.  Gradual improvement.  Continue present dose IV steroids, continue to monitor closely, still quite tenuous, inflammatory markers are finally improving but clinical improvement is slightly lagging behind.  Encouraged her to sit up in chair in the daytime use I-S and flutter valve for pulmonary toiletry and then prone in bed when at night.  SpO2: (!) 88 % O2 Flow Rate (L/min): 15 L/min  Recent Labs  Lab 05/14/19 1755 05/15/19 0006 05/16/19 0553 05/17/19 0229 05/18/19 0355  CRP 19.2* 19.1* 9.4* 5.9* 3.6*  DDIMER 1.22* 0.97* 1.69* 1.81* 1.59*  FERRITIN 1,177*  1,227* 1,181* 1,255* 846*  BNP 103.9*  --   --  52.8  --   PROCALCITON  --   --   --  0.10 <0.10    Hepatic Function Latest Ref Rng & Units 05/18/2019 05/17/2019 05/16/2019  Total Protein 6.5 - 8.1 g/dL 6.4(L) 6.6 6.6  Albumin 3.5 - 5.0 g/dL 3.0(L) 2.9(L) 2.9(L)  AST  15 - 41 U/L 32 44(H) 49(H)  ALT 0 - 44 U/L 23 23 64(H)  Alk Phosphatase 38 - 126 U/L 109 114 98  Total Bilirubin 0.3 - 1.2 mg/dL 1.1 0.7 0.5      2.  Leukocytosis.  Likely steroid related, largely unchanged repeat chest x-ray, afebrile with stable procalcitonin.  Will check UA and monitor.  3.  Minimally elevated LFTs.  Likely due to COVID-19.  Symptom-free trend.  4.  Essential hypertension.  Stable on Norvasc, will add low-dose oral hydralazine for better control.  5.  Few crackles on exam.  IV Lasix repeat on 05/18/2019.  6.  Hyperkalemia.  Likely due to steroid use.  Lasix and Kayexalate.  7.  AKI versus CKD 3.  No baseline renal function in chart, seems to be stable around creatinine of 1.4.    Condition - Extremely Guarded  Family Communication  :  husband  Code Status :  Full  Diet :   Diet Order            Diet heart healthy/carb modified Room service appropriate? Yes; Fluid consistency: Thin  Diet effective now               Disposition Plan  :  Home once better  Consults  :  None  Procedures  :     PUD Prophylaxis : PPI  DVT Prophylaxis  :    Heparin    Lab Results  Component Value Date   PLT 376 05/18/2019    Inpatient Medications  Scheduled Meds: . amLODipine  5 mg Oral Daily  . gabapentin  300 mg Oral BID  . heparin injection (subcutaneous)  7,500 Units Subcutaneous Q8H  . hydrALAZINE  50 mg Oral Q8H  . insulin aspart  0-9 Units Subcutaneous TID WC  . mouth rinse  15 mL Mouth Rinse BID  . methylPREDNISolone (SOLU-MEDROL) injection  40 mg Intravenous Daily  . pantoprazole  40 mg Oral Daily  . vitamin C  500 mg Oral Daily  . vitamin E  1,000 Units Oral Daily  . zinc sulfate   220 mg Oral Daily   Continuous Infusions: . remdesivir 100 mg in NS 100 mL 100 mg (05/18/19 0912)   PRN Meds:.acetaminophen, chlorpheniramine-HYDROcodone, guaiFENesin-dextromethorphan, hydrALAZINE, [DISCONTINUED] ondansetron **OR** ondansetron (ZOFRAN) IV, polyethylene glycol, sodium chloride, traMADol  Antibiotics  :    Anti-infectives (From admission, onward)   Start     Dose/Rate Route Frequency Ordered Stop   05/17/19 1000  remdesivir 100 mg in sodium chloride 0.9 % 100 mL IVPB     100 mg 200 mL/hr over 30 Minutes Intravenous Daily 05/16/19 2124 05/19/19 0959   05/15/19 1000  remdesivir 100 mg in sodium chloride 0.9 % 250 mL IVPB  Status:  Discontinued     100 mg 500 mL/hr over 30 Minutes Intravenous Every 24 hours 05/14/19 1708 05/16/19 2123       Time Spent in minutes  30   Lala Lund M.D on 05/18/2019 at 9:25 AM  To page go to [email or web address] - password [facility]  [facility] -  Office  [phone]   See all Orders from today for further details    Objective:   Vitals:   05/18/19 0414 05/18/19 0500 05/18/19 0517 05/18/19 0729  BP: (!) 123/51  136/64 (!) 132/58  Pulse: 72 72  79  Resp: (!) 23 (!) 22  (!) 22  Temp: 99 F (37.2 C)   (!) 97.5 F (36.4 C)  TempSrc: Axillary  Oral  SpO2: 91% 90%  (!) 88%  Weight:      Height:        Wt Readings from Last 3 Encounters:  05/14/19 54.7 kg     Intake/Output Summary (Last 24 hours) at 05/18/2019 0925 Last data filed at 05/17/2019 1923 Gross per 24 hour  Intake 120 ml  Output 550 ml  Net -430 ml   Physical Exam  Awake Alert,   No new F.N deficits, Normal affect [place].AT,PERRAL Supple Neck,No JVD, No cervical lymphadenopathy appriciated.  Symmetrical Chest wall movement, Good air movement bilaterally, +ve rales RRR,No Gallops, Rubs or new Murmurs, No Parasternal Heave +ve B.Sounds, Abd Soft, No tenderness, No organomegaly appriciated, No rebound - guarding or rigidity. No Cyanosis, Clubbing or edema, No  new Rash or bruise    Data Review:    CBC Recent Labs  Lab 05/14/19 1755 05/15/19 0006 05/16/19 0553 05/17/19 0229 05/18/19 0355  WBC 7.6 10.3 16.8* 19.4* 21.1*  HGB 12.7 12.8 11.3* 11.7* 12.0  HCT 37.8 38.7 33.7* 35.2* 35.8*  PLT 231 282 300 356 376  MCV 95.5 95.3 94.4 95.4 94.5  MCH 32.1 31.5 31.7 31.7 31.7  MCHC 33.6 33.1 33.5 33.2 33.5  RDW 13.6 14.0 13.9 13.7 13.4  LYMPHSABS 1.3 1.0 2.1 2.1 2.2  MONOABS 0.5 0.8 1.1* 1.2* 1.3*  EOSABS 0.0 0.0 0.0 0.0 0.0  BASOSABS 0.1 0.0 0.1 0.1 0.2*    Chemistries  Recent Labs  Lab 05/14/19 1755 05/15/19 0006 05/16/19 0553 05/17/19 0229 05/18/19 0355  NA 143 142 137 140 137  K 4.9 4.5 4.3 5.1 5.3*  CL 112* 107 104 105 100  CO2 19* 22 19* 22 24  GLUCOSE 222* 173* 200* 201* 168*  BUN 44* 55* 75* 65* 71*  CREATININE 1.43* 1.65* 1.38* 1.19* 1.45*  CALCIUM 9.0 9.0 8.6* 9.0 9.0  MG  --   --   --   --  2.2  AST 62* 58* 49* 44* 32  ALT 23 21 64* 23 23  ALKPHOS 100 99 98 114 109  BILITOT 0.5 0.7 0.5 0.7 1.1   ------------------------------------------------------------------------------------------------------------------ No results for input(s): CHOL, HDL, LDLCALC, TRIG, CHOLHDL, LDLDIRECT in the last 72 hours.  Lab Results  Component Value Date   HGBA1C 6.7 (H) 05/14/2019   ------------------------------------------------------------------------------------------------------------------ No results for input(s): TSH, T4TOTAL, T3FREE, THYROIDAB in the last 72 hours.  Invalid input(s): FREET3  Cardiac Enzymes No results for input(s): CKMB, TROPONINI, MYOGLOBIN in the last 168 hours.  Invalid input(s): CK ------------------------------------------------------------------------------------------------------------------    Component Value Date/Time   BNP 52.8 05/17/2019 0229    Micro Results No results found for this or any previous visit (from the past 240 hour(s)).  Radiology Reports Cxr Am  Result Date:  05/17/2019 CLINICAL DATA:  Short of breath EXAM: PORTABLE CHEST 1 VIEW COMPARISON:  05/14/2019 FINDINGS: Decreased density of perihilar airspace disease compared to prior. Mild central airspace disease as remain. No pleural fluid. No pneumothorax. IMPRESSION: 1. Improvement in perihilar airspace disease. 2. Mild central airspace disease remains. Electronically Signed   By: Suzy Bouchard M.D.   On: 05/17/2019 11:32

## 2019-05-18 NOTE — Plan of Care (Signed)
  Problem: Education: Goal: Knowledge of risk factors and measures for prevention of condition will improve Outcome: Progressing   Problem: Coping: Goal: Psychosocial and spiritual needs will be supported Outcome: Progressing   Problem: Respiratory: Goal: Will maintain a patent airway Outcome: Progressing Goal: Complications related to the disease process, condition or treatment will be avoided or minimized Outcome: Progressing   

## 2019-05-19 LAB — COMPREHENSIVE METABOLIC PANEL
ALT: 23 U/L (ref 0–44)
AST: 30 U/L (ref 15–41)
Albumin: 3.2 g/dL — ABNORMAL LOW (ref 3.5–5.0)
Alkaline Phosphatase: 115 U/L (ref 38–126)
Anion gap: 14 (ref 5–15)
BUN: 73 mg/dL — ABNORMAL HIGH (ref 8–23)
CO2: 25 mmol/L (ref 22–32)
Calcium: 9 mg/dL (ref 8.9–10.3)
Chloride: 99 mmol/L (ref 98–111)
Creatinine, Ser: 1.36 mg/dL — ABNORMAL HIGH (ref 0.44–1.00)
GFR calc Af Amer: 42 mL/min — ABNORMAL LOW (ref >60)
GFR calc non Af Amer: 36 mL/min — ABNORMAL LOW (ref >60)
Glucose, Bld: 164 mg/dL — ABNORMAL HIGH (ref 70–99)
Potassium: 3.9 mmol/L (ref 3.5–5.1)
Sodium: 138 mmol/L (ref 135–145)
Total Bilirubin: 0.5 mg/dL (ref 0.3–1.2)
Total Protein: 6.6 g/dL (ref 6.5–8.1)

## 2019-05-19 LAB — CBC WITH DIFFERENTIAL/PLATELET
Abs Immature Granulocytes: 1.27 10*3/uL — ABNORMAL HIGH (ref 0.00–0.07)
Basophils Absolute: 0.2 10*3/uL — ABNORMAL HIGH (ref 0.0–0.1)
Basophils Relative: 1 %
Eosinophils Absolute: 0.1 10*3/uL (ref 0.0–0.5)
Eosinophils Relative: 0 %
HCT: 37.9 % (ref 36.0–46.0)
Hemoglobin: 12.7 g/dL (ref 12.0–15.0)
Immature Granulocytes: 7 %
Lymphocytes Relative: 7 %
Lymphs Abs: 1.3 10*3/uL (ref 0.7–4.0)
MCH: 31.4 pg (ref 26.0–34.0)
MCHC: 33.5 g/dL (ref 30.0–36.0)
MCV: 93.8 fL (ref 80.0–100.0)
Monocytes Absolute: 1 10*3/uL (ref 0.1–1.0)
Monocytes Relative: 5 %
Neutro Abs: 15.1 10*3/uL — ABNORMAL HIGH (ref 1.7–7.7)
Neutrophils Relative %: 80 %
Platelets: 398 10*3/uL (ref 150–400)
RBC: 4.04 MIL/uL (ref 3.87–5.11)
RDW: 13.3 % (ref 11.5–15.5)
WBC: 18.9 10*3/uL — ABNORMAL HIGH (ref 4.0–10.5)
nRBC: 0.2 % (ref 0.0–0.2)

## 2019-05-19 LAB — MAGNESIUM: Magnesium: 2.2 mg/dL (ref 1.7–2.4)

## 2019-05-19 LAB — URINE CULTURE: Culture: NO GROWTH

## 2019-05-19 LAB — GLUCOSE, CAPILLARY
Glucose-Capillary: 333 mg/dL — ABNORMAL HIGH (ref 70–99)
Glucose-Capillary: 131 mg/dL — ABNORMAL HIGH (ref 70–99)
Glucose-Capillary: 302 mg/dL — ABNORMAL HIGH (ref 70–99)
Glucose-Capillary: 275 mg/dL — ABNORMAL HIGH (ref 70–99)
Glucose-Capillary: 294 mg/dL — ABNORMAL HIGH (ref 70–99)

## 2019-05-19 LAB — BRAIN NATRIURETIC PEPTIDE: B Natriuretic Peptide: 46.5 pg/mL (ref 0.0–100.0)

## 2019-05-19 LAB — D-DIMER, QUANTITATIVE: D-Dimer, Quant: 1.52 ug/mL-FEU — ABNORMAL HIGH (ref 0.00–0.50)

## 2019-05-19 LAB — C-REACTIVE PROTEIN: CRP: 2.6 mg/dL — ABNORMAL HIGH (ref <1.0)

## 2019-05-19 LAB — PROCALCITONIN: Procalcitonin: 0.13 ng/mL

## 2019-05-19 LAB — FERRITIN: Ferritin: 960 ng/mL — ABNORMAL HIGH (ref 11–307)

## 2019-05-19 MED ORDER — FUROSEMIDE 10 MG/ML IJ SOLN
40.0000 mg | Freq: Once | INTRAMUSCULAR | Status: AC
  Administered 2019-05-19: 40 mg via INTRAVENOUS
  Filled 2019-05-19: qty 4

## 2019-05-19 NOTE — Progress Notes (Signed)
Results for CHITARA, PITMON (MRN [id number]) as of 05/19/2019 12:35  Ref. Range 05/18/2019 07:29 05/18/2019 11:13 05/18/2019 15:55 05/18/2019 19:35 05/19/2019 07:12  Glucose-Capillary Latest Ref Range: 70 - 99 mg/dL 132 (H) 234 (H) 333 (H) 290 (H) 131 (H)  Note that postprandial CBG are 200-300 mg/dl.   Recommend increasing Novolog correction scale to MODERATE TID & HS scale if blood sugars continue to be elevated. Patient does not seem to be eating more than 30 % of meal. She would only get meal coverage if eating at least 50% of meal.   Will continue to monitor blood sugars while in the hospital.  Harvel Ricks RN BSN CDE Diabetes Coordinator Pager: [phone]  8am-5pm

## 2019-05-19 NOTE — Progress Notes (Signed)
PROGRESS NOTE                                                                                                                                                                                                             Patient Demographics:    Virginia Chan, is a 83 y.o. female, DOB - 1935-06-19, [id number]  Outpatient Primary MD for the patient is Street, [name], MD    LOS - 5  Admit date - 05/14/2019    CC - SOB     Brief Narrative  Patient is a 83 y.o. highly functional female (still works) with PMHx of hypertension-presented with approximately 1 week history of fatigue and malaise-and 2-3-day history of worsening shortness of breath.  She is evaluated at [facility] health-she was positive for COVID-19-she was found to have severe hypoxemia requiring NRB in the setting of COVID-19 pneumonia.   Subjective:   Patient in bed, appears comfortable, denies any headache, no fever, no chest pain or pressure, no shortness of breath , no abdominal pain. No focal weakness.    Assessment  & Plan :     1. Acute Hypoxic Resp. Failure due to Acute Covid 19 Viral Pneumonitis during the ongoing 2020 Covid 19 Pandemic - she had severe disease and is still requiring 15 L nasal cannula oxygen, she has been started on IV steroids and remdesivir, also received convalescent plasma and Actemra.  Gradual improvement.  Continue present dose IV steroids, continue to monitor closely, still quite tenuous, inflammatory markers are finally improving but clinical improvement is slightly lagging behind.  Encouraged her to sit up in chair in the daytime use I-S and flutter valve for pulmonary toiletry and then prone in bed when at night.  SpO2: 97 % O2 Flow Rate (L/min): (S) 10 L/min  Recent Labs  Lab 05/14/19 1755 05/15/19 0006 05/16/19 0553 05/17/19 0229 05/18/19 0355 05/19/19 0100  CRP 19.2* 19.1* 9.4* 5.9* 3.6* 2.6*  DDIMER 1.22* 0.97*  1.69* 1.81* 1.59* 1.52*  FERRITIN 1,177* 1,227* 1,181* 1,255* 846* 960*  BNP 103.9*  --   --  52.8 68.9 46.5  PROCALCITON  --   --   --  0.10 <0.10 0.13    Hepatic Function Latest Ref Rng & Units 05/19/2019 05/18/2019 05/17/2019  Total Protein 6.5 - 8.1 g/dL 6.6 6.4(L) 6.6  Albumin 3.5 - 5.0  g/dL 3.2(L) 3.0(L) 2.9(L)  AST 15 - 41 U/L 30 32 44(H)  ALT 0 - 44 U/L _0 Alk Phosphatase 38 - 126 U/L 115 109 114  Total Bilirubin 0.3 - 1.2 mg/dL 0.5 1.1 0.7      2.  Leukocytosis.  Is due to steroid use, stable chest x-ray, UA and procalcitonin.  Afebrile.  3.  Minimally elevated LFTs.  Likely due to COVID-19.  Symptom-free trend.  4.  Essential hypertension.  Stable on Norvasc, will add low-dose oral hydralazine for better control.  5.  Few crackles on exam.  IV Lasix repeat on 05/18/2019 and on 05/19/2019.  6.  Hyperkalemia.  Steroid related, resolved after Lasix and Kayexalate.  7.  AKI versus CKD 3.  No baseline renal function in chart, seems to be stable around creatinine of 1.4.    Condition - Extremely Guarded  Family Communication  :  Son daily, 12/7  Code Status :  Full  Diet :   Diet Order            Diet heart healthy/carb modified Room service appropriate? Yes; Fluid consistency: Thin  Diet effective now               Disposition Plan  :  Home once better  Consults  :  None  Procedures  :     PUD Prophylaxis : PPI  DVT Prophylaxis  :    Heparin    Lab Results  Component Value Date   PLT 398 05/19/2019    Inpatient Medications  Scheduled Meds: . amLODipine  5 mg Oral Daily  . gabapentin  300 mg Oral BID  . heparin injection (subcutaneous)  7,500 Units Subcutaneous Q8H  . hydrALAZINE  50 mg Oral Q8H  . insulin aspart  0-9 Units Subcutaneous TID WC  . mouth rinse  15 mL Mouth Rinse BID  . methylPREDNISolone (SOLU-MEDROL) injection  40 mg Intravenous Daily  . pantoprazole  40 mg Oral Daily  . vitamin C  500 mg Oral Daily  . vitamin E  1,000  Units Oral Daily  . zinc sulfate  220 mg Oral Daily   Continuous Infusions:  PRN Meds:.acetaminophen, chlorpheniramine-HYDROcodone, guaiFENesin-dextromethorphan, hydrALAZINE, [DISCONTINUED] ondansetron **OR** ondansetron (ZOFRAN) IV, polyethylene glycol, sodium chloride, traMADol  Antibiotics  :    Anti-infectives (From admission, onward)   Start     Dose/Rate Route Frequency Ordered Stop   05/17/19 1000  remdesivir 100 mg in sodium chloride 0.9 % 100 mL IVPB     100 mg 200 mL/hr over 30 Minutes Intravenous Daily 05/16/19 2124 05/18/19 0945   05/15/19 1000  remdesivir 100 mg in sodium chloride 0.9 % 250 mL IVPB  Status:  Discontinued     100 mg 500 mL/hr over 30 Minutes Intravenous Every 24 hours 05/14/19 1708 05/16/19 2123       Time Spent in minutes  30   Lala Lund M.D on 05/19/2019 at 11:42 AM  To page go to [email or web address] - password [email or web address]  [facility] -  Office  [phone]   See all Orders from today for further details    Objective:   Vitals:   05/18/19 2320 05/19/19 0342 05/19/19 0718 05/19/19 0800  BP:  (!) 144/59 134/71   Pulse: 87 80 83 80  Resp: (!) _1 (!) 22  Temp:  97.9 F (36.6 C) 98 F (36.7 C)   TempSrc:  Oral Oral   SpO2: 93% 94% 96% 97%  Weight:  Height:        Wt Readings from Last 3 Encounters:  05/14/19 54.7 kg     Intake/Output Summary (Last 24 hours) at 05/19/2019 1142 Last data filed at 05/19/2019 0900 Gross per 24 hour  Intake 300 ml  Output 1000 ml  Net -700 ml   Physical Exam  Awake Alert, Oriented X 3, No new F.N deficits, Normal affect [place].AT,PERRAL Supple Neck,No JVD, No cervical lymphadenopathy appriciated.  Symmetrical Chest wall movement, Good air movement bilaterally, CTAB RRR,No Gallops, Rubs or new Murmurs, No Parasternal Heave +ve B.Sounds, Abd Soft, No tenderness, No organomegaly appriciated, No rebound - guarding or rigidity. No Cyanosis, Clubbing or edema, No new Rash or bruise     Data  Review:    CBC Recent Labs  Lab 05/15/19 0006 05/16/19 0553 05/17/19 0229 05/18/19 0355 05/19/19 0100  WBC 10.3 16.8* 19.4* 21.1* 18.9*  HGB 12.8 11.3* 11.7* 12.0 12.7  HCT 38.7 33.7* 35.2* 35.8* 37.9  PLT 282 300 356 376 398  MCV 95.3 94.4 95.4 94.5 93.8  MCH 31.5 31.7 31.7 31.7 31.4  MCHC 33.1 33.5 33.2 33.5 33.5  RDW 14.0 13.9 13.7 13.4 13.3  LYMPHSABS 1.0 2.1 2.1 2.2 1.3  MONOABS 0.8 1.1* 1.2* 1.3* 1.0  EOSABS 0.0 0.0 0.0 0.0 0.1  BASOSABS 0.0 0.1 0.1 0.2* 0.2*    Chemistries  Recent Labs  Lab 05/15/19 0006 05/16/19 0553 05/17/19 0229 05/18/19 0355 05/19/19 0100  NA 142 137 140 137 138  K 4.5 4.3 5.1 5.3* 3.9  CL 107 104 105 100 99  CO2 22 19* _0 GLUCOSE 173* 200* 201* 168* 164*  BUN 55* 75* 65* 71* 73*  CREATININE 1.65* 1.38* 1.19* 1.45* 1.36*  CALCIUM 9.0 8.6* 9.0 9.0 9.0  MG  --   --   --  2.2 2.2  AST 58* 49* 44* 32 30  ALT 21 64* _1 ALKPHOS 99 98 114 109 115  BILITOT 0.7 0.5 0.7 1.1 0.5   ------------------------------------------------------------------------------------------------------------------ No results for input(s): CHOL, HDL, LDLCALC, TRIG, CHOLHDL, LDLDIRECT in the last 72 hours.  Lab Results  Component Value Date   HGBA1C 6.7 (H) 05/14/2019   ------------------------------------------------------------------------------------------------------------------ No results for input(s): TSH, T4TOTAL, T3FREE, THYROIDAB in the last 72 hours.  Invalid input(s): FREET3  Cardiac Enzymes No results for input(s): CKMB, TROPONINI, MYOGLOBIN in the last 168 hours.  Invalid input(s): CK ------------------------------------------------------------------------------------------------------------------    Component Value Date/Time   BNP 46.5 05/19/2019 0100    Micro Results No results found for this or any previous visit (from the past 240 hour(s)).  Radiology Reports Cxr Am  Result Date: 05/17/2019 CLINICAL DATA:  Short of  breath EXAM: PORTABLE CHEST 1 VIEW COMPARISON:  05/14/2019 FINDINGS: Decreased density of perihilar airspace disease compared to prior. Mild central airspace disease as remain. No pleural fluid. No pneumothorax. IMPRESSION: 1. Improvement in perihilar airspace disease. 2. Mild central airspace disease remains. Electronically Signed   By: Suzy Bouchard M.D.   On: 05/17/2019 11:32

## 2019-05-19 NOTE — Progress Notes (Signed)
Occupational Therapy Treatment Patient Details Name: Virginia Chan MRN: [id number] DOB: July 11, 1935 Today's Date: 05/19/2019    History of present illness 83 y/o w/ hx of HTN, presented w/ 1 week c/o fatigue/malaise developing into intermittent fever and myalgias. At Ed found to have profound hypoxia needing NRB, x-ray showed multifocal PNA, and + COVID test.   OT comments  Pt progressing toward stated goals, focused session on BADL mobility progression this date. Pt able to tolerate functional mobility progression 8-10 ft with 1HHA at mod A level. Pt on 5L HFNC with VSS throughout. Cues for pursed lip breathing given. Pt incontinent of bladder throughout session, needin mod-max A for LB bathing and dressing for clean up. Pt finished session in bed after reported sitting up in chair ~5 hours. Recommend CIR at d/c. Will continue to follow.   Follow Up Recommendations  CIR;Supervision/Assistance - 24 hour    Equipment Recommendations  Tub/shower seat    Recommendations for Other Services      Precautions / Restrictions Precautions Precautions: Fall Precaution Comments: desats easily, watch HR Restrictions Weight Bearing Restrictions: No       Mobility Bed Mobility Overal bed mobility: Needs Assistance Bed Mobility: Sit to Supine       Sit to supine: Min assist   General bed mobility comments: min A to guide BLEs back to bed and align trunk  Transfers Overall transfer level: Needs assistance Equipment used: 1 person hand held assist Transfers: Sit to/from [facility] Sit to Stand: Min assist Stand pivot transfers: Mod assist       General transfer comment: assist to rise and steady    Balance Overall balance assessment: Needs assistance Sitting-balance support: Feet supported Sitting balance-Leahy Scale: Fair     Standing balance support: During functional activity;Single extremity supported Standing balance-Leahy Scale: Poor Standing balance  comment: reliant on min-mod A from therapist for support                           ADL either performed or assessed with clinical judgement   ADL Overall ADL's : Needs assistance/impaired                         Toilet Transfer: Moderate assistance;Stand-pivot;RW Toilet Transfer Details (indicate cue type and reason): simulated with recliner         Functional mobility during ADLs: Moderate assistance;Cueing for sequencing General ADL Comments: focused on progressing BADL mobility this date, able to tolerate taking small steps this date rather than pivot only     Vision Patient Visual Report: No change from baseline     Perception     Praxis      Cognition Arousal/Alertness: Awake/alert Behavior During Therapy: Anxious Overall Cognitive Status: Within Functional Limits for tasks assessed                                 General Comments: continues to be anxious, but this improves with re assurance and cues        Exercises     Shoulder Instructions       General Comments      Pertinent Vitals/ Pain       Pain Assessment: No/denies pain  Home Living  Prior Functioning/Environment              Frequency  Min 2X/week        Progress Toward Goals  OT Goals(current goals can now be found in the care plan section)  Progress towards OT goals: Progressing toward goals  Acute Rehab OT Goals Patient Stated Goal: go home OT Goal Formulation: With patient Time For Goal Achievement: 05/29/19 Potential to Achieve Goals: Good  Plan Discharge plan remains appropriate    Co-evaluation                 AM-PAC OT "6 Clicks" Daily Activity     Outcome Measure   Help from another person eating meals?: A Little Help from another person taking care of personal grooming?: A Little Help from another person toileting, which includes using toliet, bedpan, or  urinal?: A Lot Help from another person bathing (including washing, rinsing, drying)?: A Lot Help from another person to put on and taking off regular upper body clothing?: A Lot Help from another person to put on and taking off regular lower body clothing?: A Lot 6 Click Score: 14    End of Session Equipment Utilized During Treatment: Oxygen;Gait belt  OT Visit Diagnosis: Muscle weakness (generalized) (M62.81);Other abnormalities of gait and mobility (R26.89);Unsteadiness on feet (R26.81)   Activity Tolerance Patient tolerated treatment well   Patient Left in bed;with call bell/phone within reach;with bed alarm set   Nurse Communication Mobility status        Time: [id number] OT Time Calculation (min): 35 min  Charges: OT General Charges $OT Visit: 1 Visit OT Treatments $Self Care/Home Management : 23-37 mins  Zenovia Jarred, MSOT, OTR/L [place] [facility] Office: [phone]   Zenovia Jarred 05/19/2019, 5:21 PM

## 2019-05-20 LAB — CBC WITH DIFFERENTIAL/PLATELET
Abs Immature Granulocytes: 1.32 10*3/uL — ABNORMAL HIGH (ref 0.00–0.07)
Basophils Absolute: 0.1 10*3/uL (ref 0.0–0.1)
Basophils Relative: 1 %
Eosinophils Absolute: 0.2 10*3/uL (ref 0.0–0.5)
Eosinophils Relative: 1 %
HCT: 37.4 % (ref 36.0–46.0)
Hemoglobin: 12.7 g/dL (ref 12.0–15.0)
Immature Granulocytes: 8 %
Lymphocytes Relative: 5 %
Lymphs Abs: 0.9 10*3/uL (ref 0.7–4.0)
MCH: 32.2 pg (ref 26.0–34.0)
MCHC: 34 g/dL (ref 30.0–36.0)
MCV: 94.7 fL (ref 80.0–100.0)
Monocytes Absolute: 0.8 10*3/uL (ref 0.1–1.0)
Monocytes Relative: 5 %
Neutro Abs: 13.9 10*3/uL — ABNORMAL HIGH (ref 1.7–7.7)
Neutrophils Relative %: 80 %
Platelets: 364 10*3/uL (ref 150–400)
RBC: 3.95 MIL/uL (ref 3.87–5.11)
RDW: 13.2 % (ref 11.5–15.5)
WBC: 17.2 10*3/uL — ABNORMAL HIGH (ref 4.0–10.5)
nRBC: 0.1 % (ref 0.0–0.2)

## 2019-05-20 LAB — GLUCOSE, CAPILLARY
Glucose-Capillary: 145 mg/dL — ABNORMAL HIGH (ref 70–99)
Glucose-Capillary: 278 mg/dL — ABNORMAL HIGH (ref 70–99)
Glucose-Capillary: 277 mg/dL — ABNORMAL HIGH (ref 70–99)

## 2019-05-20 LAB — C-REACTIVE PROTEIN: CRP: 1.2 mg/dL — ABNORMAL HIGH (ref <1.0)

## 2019-05-20 LAB — COMPREHENSIVE METABOLIC PANEL
ALT: 21 U/L (ref 0–44)
AST: 27 U/L (ref 15–41)
Albumin: 3.1 g/dL — ABNORMAL LOW (ref 3.5–5.0)
Alkaline Phosphatase: 106 U/L (ref 38–126)
Anion gap: 12 (ref 5–15)
BUN: 75 mg/dL — ABNORMAL HIGH (ref 8–23)
CO2: 26 mmol/L (ref 22–32)
Calcium: 8.9 mg/dL (ref 8.9–10.3)
Chloride: 93 mmol/L — ABNORMAL LOW (ref 98–111)
Creatinine, Ser: 1.31 mg/dL — ABNORMAL HIGH (ref 0.44–1.00)
GFR calc Af Amer: 44 mL/min — ABNORMAL LOW (ref >60)
GFR calc non Af Amer: 38 mL/min — ABNORMAL LOW (ref >60)
Glucose, Bld: 167 mg/dL — ABNORMAL HIGH (ref 70–99)
Potassium: 3.6 mmol/L (ref 3.5–5.1)
Sodium: 131 mmol/L — ABNORMAL LOW (ref 135–145)
Total Bilirubin: 0.8 mg/dL (ref 0.3–1.2)
Total Protein: 6.3 g/dL — ABNORMAL LOW (ref 6.5–8.1)

## 2019-05-20 LAB — MAGNESIUM: Magnesium: 2.4 mg/dL (ref 1.7–2.4)

## 2019-05-20 LAB — BRAIN NATRIURETIC PEPTIDE: B Natriuretic Peptide: 50.6 pg/mL (ref 0.0–100.0)

## 2019-05-20 LAB — D-DIMER, QUANTITATIVE: D-Dimer, Quant: 1.66 ug/mL-FEU — ABNORMAL HIGH (ref 0.00–0.50)

## 2019-05-20 MED ORDER — FUROSEMIDE 10 MG/ML IJ SOLN
60.0000 mg | Freq: Once | INTRAMUSCULAR | Status: AC
  Administered 2019-05-20: 60 mg via INTRAVENOUS
  Filled 2019-05-20: qty 6

## 2019-05-20 MED ORDER — METHYLPREDNISOLONE SODIUM SUCC 40 MG IJ SOLR
20.0000 mg | Freq: Every day | INTRAMUSCULAR | Status: DC
  Filled 2019-05-20: qty 1

## 2019-05-20 NOTE — Plan of Care (Signed)
  Problem: Coping: Goal: Psychosocial and spiritual needs will be supported Outcome: Not Progressing  Patient feels sad about staying in hospital. Emotional support provided.  Problem: Respiratory: Goal: Complications related to the disease process, condition or treatment will be avoided or minimized Outcome: Not Progressing  Desats with activity. O2 monitored.

## 2019-05-20 NOTE — Progress Notes (Signed)
   05/20/19 1918  RN Transport Assessment (Complete at Beginning of Shift)  RN to accompany transport? No  Transport method Wheelchair  Oxygen needed for transport? Yes  Does IV pole need to accompany transport? No  COVID-19 Universal Masking   Universal Masking-Does patient have mask on (ED/Ambulatory) / is patient wearing mask when staff enters room or during transport (Inpatient)? No  Mask contraindicated/refused SOB  Hand-Off documentation  Handoff Given Given to shift RN/LPN  Report given to (Full Name) Gaynell Face, RN  Report received from (Full Name) Tenna Delaine, RN  Check in  Check in process completed (see row info) Yes

## 2019-05-20 NOTE — Progress Notes (Signed)
PROGRESS NOTE                                                                                                                                                                                                             Patient Demographics:    Virginia Chan, is a 83 y.o. female, DOB - 1935-07-18, [id number]  Outpatient Primary MD for the patient is Street, [name], MD    LOS - 6  Admit date - 05/14/2019    CC - SOB     Brief Narrative  Patient is a 83 y.o. highly functional female (still works) with PMHx of hypertension-presented with approximately 1 week history of fatigue and malaise-and 2-3-day history of worsening shortness of breath.  She is evaluated at [facility] health-she was positive for COVID-19-she was found to have severe hypoxemia requiring NRB in the setting of COVID-19 pneumonia.   Subjective:   Patient in bed, appears comfortable, denies any headache, no fever, no chest pain or pressure, improving shortness of breath , no abdominal pain. No focal weakness.   Assessment  & Plan :     1. Acute Hypoxic Resp. Failure due to Acute Covid 19 Viral Pneumonitis during the ongoing 2020 Covid 19 Pandemic - she had severe disease and is still requiring 15 L nasal cannula oxygen, she has been started on IV steroids and remdesivir, also received convalescent plasma and Actemra.  Gradual improvement.  Continue present dose IV steroids, continue to monitor closely, still quite tenuous, inflammatory markers are finally improving but clinical improvement is slightly lagging behind.  Encouraged her to sit up in chair in the daytime use I-S and flutter valve for pulmonary toiletry and then prone in bed when at night.  SpO2: 90 % O2 Flow Rate (L/min): 10 L/min  Recent Labs  Lab 05/14/19 1755 05/15/19 0006 05/16/19 0553 05/17/19 0229 05/18/19 0355 05/19/19 0100 05/20/19 0301  CRP 19.2* 19.1* 9.4* 5.9* 3.6* 2.6* 1.2*   DDIMER 1.22* 0.97* 1.69* 1.81* 1.59* 1.52* 1.66*  FERRITIN 1,177* 1,227* 1,181* 1,255* 846* 960*  --   BNP 103.9*  --   --  52.8 68.9 46.5 50.6  PROCALCITON  --   --   --  0.10 <0.10 0.13  --     Hepatic Function Latest Ref Rng & Units 05/20/2019 05/19/2019 05/18/2019  Total Protein 6.5 - 8.1  g/dL 6.3(L) 6.6 6.4(L)  Albumin 3.5 - 5.0 g/dL 3.1(L) 3.2(L) 3.0(L)  AST 15 - 41 U/L 27 30 32  ALT 0 - 44 U/L _0 Alk Phosphatase 38 - 126 U/L 106 115 109  Total Bilirubin 0.3 - 1.2 mg/dL 0.8 0.5 1.1      2.  Leukocytosis.  Is due to steroid use, stable chest x-ray, UA and procalcitonin.  Afebrile.  3.  Minimally elevated LFTs.  Likely due to COVID-19.  Symptom-free trend.  4.  Essential hypertension.  Stable on Norvasc, will add low-dose oral hydralazine for better control.  5.  Few crackles on exam.  IV Lasix repeat on 05/20/2019 .  6.  Hyperkalemia.  Steroid related, resolved after Lasix and Kayexalate.  7.  AKI versus CKD 3.  No baseline renal function in chart, seems to be stable around creatinine of 1.4.    Condition - Extremely Guarded  Family Communication  :  Son  12/7, husband 12/8  Code Status :  Full  Diet :   Diet Order            Diet heart healthy/carb modified Room service appropriate? Yes; Fluid consistency: Thin  Diet effective now               Disposition Plan  :  Home once better  Consults  :  None  Procedures  :     PUD Prophylaxis : PPI  DVT Prophylaxis  :    Heparin    Lab Results  Component Value Date   PLT 364 05/20/2019    Inpatient Medications  Scheduled Meds: . amLODipine  5 mg Oral Daily  . gabapentin  300 mg Oral BID  . heparin injection (subcutaneous)  7,500 Units Subcutaneous Q8H  . hydrALAZINE  50 mg Oral Q8H  . insulin aspart  0-9 Units Subcutaneous TID WC  . mouth rinse  15 mL Mouth Rinse BID  . methylPREDNISolone (SOLU-MEDROL) injection  40 mg Intravenous Daily  . pantoprazole  40 mg Oral Daily  . vitamin C  500 mg  Oral Daily  . vitamin E  1,000 Units Oral Daily  . zinc sulfate  220 mg Oral Daily   Continuous Infusions:  PRN Meds:.acetaminophen, chlorpheniramine-HYDROcodone, guaiFENesin-dextromethorphan, hydrALAZINE, [DISCONTINUED] ondansetron **OR** ondansetron (ZOFRAN) IV, polyethylene glycol, sodium chloride, traMADol  Antibiotics  :    Anti-infectives (From admission, onward)   Start     Dose/Rate Route Frequency Ordered Stop   05/17/19 1000  remdesivir 100 mg in sodium chloride 0.9 % 100 mL IVPB     100 mg 200 mL/hr over 30 Minutes Intravenous Daily 05/16/19 2124 05/18/19 0945   05/15/19 1000  remdesivir 100 mg in sodium chloride 0.9 % 250 mL IVPB  Status:  Discontinued     100 mg 500 mL/hr over 30 Minutes Intravenous Every 24 hours 05/14/19 1708 05/16/19 2123       Time Spent in minutes  30   Lala Lund M.D on 05/20/2019 at 10:49 AM  To page go to [email or web address] - password [facility]  [facility] -  Office  [phone]   See all Orders from today for further details    Objective:   Vitals:   05/20/19 0050 05/20/19 0607 05/20/19 0613 05/20/19 0755  BP: (!) 151/57 (!) 148/52  (!) 121/51  Pulse: 81  79 82  Resp: 20 (!) 21 19 (!) 22  Temp: 98.6 F (37 C) (!) 97.5 F (36.4 C)  97.8 F (36.6 C)  TempSrc: Oral Oral  Oral  SpO2: 93% 91% 91% 90%  Weight:      Height:        Wt Readings from Last 3 Encounters:  05/14/19 54.7 kg     Intake/Output Summary (Last 24 hours) at 05/20/2019 1049 Last data filed at 05/19/2019 2200 Gross per 24 hour  Intake 120 ml  Output 800 ml  Net -680 ml   Physical Exam  Awake Alert,   No new F.N deficits, Normal affect [place].AT,PERRAL Supple Neck,No JVD, No cervical lymphadenopathy appriciated.  Symmetrical Chest wall movement, Good air movement bilaterally, few rales RRR,No Gallops, Rubs or new Murmurs, No Parasternal Heave +ve B.Sounds, Abd Soft, No tenderness, No organomegaly appriciated, No rebound - guarding or rigidity. No  Cyanosis, Clubbing or edema, No new Rash or bruise    Data Review:    CBC Recent Labs  Lab 05/16/19 0553 05/17/19 0229 05/18/19 0355 05/19/19 0100 05/20/19 0301  WBC 16.8* 19.4* 21.1* 18.9* 17.2*  HGB 11.3* 11.7* 12.0 12.7 12.7  HCT 33.7* 35.2* 35.8* 37.9 37.4  PLT 300 356 376 398 364  MCV 94.4 95.4 94.5 93.8 94.7  MCH 31.7 31.7 31.7 31.4 32.2  MCHC 33.5 33.2 33.5 33.5 34.0  RDW 13.9 13.7 13.4 13.3 13.2  LYMPHSABS 2.1 2.1 2.2 1.3 0.9  MONOABS 1.1* 1.2* 1.3* 1.0 0.8  EOSABS 0.0 0.0 0.0 0.1 0.2  BASOSABS 0.1 0.1 0.2* 0.2* 0.1    Chemistries  Recent Labs  Lab 05/16/19 0553 05/17/19 0229 05/18/19 0355 05/19/19 0100 05/20/19 0301  NA 137 140 137 138 131*  K 4.3 5.1 5.3* 3.9 3.6  CL 104 105 100 99 93*  CO2 19* 22 24 25 26  GLUCOSE 200* 201* 168* 164* 167*  BUN 75* 65* 71* 73* 75*  CREATININE 1.38* 1.19* 1.45* 1.36* 1.31*  CALCIUM 8.6* 9.0 9.0 9.0 8.9  MG  --   --  2.2 2.2 2.4  AST 49* 44* 32 30 27  ALT 64* 23 23 23 21  ALKPHOS 98 114 109 115 106  BILITOT 0.5 0.7 1.1 0.5 0.8   ------------------------------------------------------------------------------------------------------------------ No results for input(s): CHOL, HDL, LDLCALC, TRIG, CHOLHDL, LDLDIRECT in the last 72 hours.  Lab Results  Component Value Date   HGBA1C 6.7 (H) 05/14/2019   ------------------------------------------------------------------------------------------------------------------ No results for input(s): TSH, T4TOTAL, T3FREE, THYROIDAB in the last 72 hours.  Invalid input(s): FREET3  Cardiac Enzymes No results for input(s): CKMB, TROPONINI, MYOGLOBIN in the last 168 hours.  Invalid input(s): CK ------------------------------------------------------------------------------------------------------------------    Component Value Date/Time   BNP 50.6 05/20/2019 0301    Micro Results Recent Results (from the past 240 hour(s))  Culture, Urine     Status: None   Collection Time:  05/18/19  7:30 AM   Specimen: Urine, Random  Result Value Ref Range Status   Specimen Description   Final    URINE, RANDOM Performed at [facility], [address] [place], [place] [address]    Special Requests   Final    NONE Performed at [facility], [address] [place], [place] [address]    Culture   Final    NO GROWTH Performed at [facility], [address] [place], [place] [address]    Report Status 05/19/2019 FINAL  Final    Radiology Reports Cxr Am  Result Date: 05/17/2019 CLINICAL DATA:  Short of breath EXAM: PORTABLE CHEST 1 VIEW COMPARISON:  05/14/2019 FINDINGS: Decreased density of perihilar airspace disease   compared to prior. Mild central airspace disease as remain. No pleural fluid. No pneumothorax. IMPRESSION: 1. Improvement in perihilar airspace disease. 2. Mild central airspace disease remains. Electronically Signed   By: Suzy Bouchard M.D.   On: 05/17/2019 11:32

## 2019-05-20 NOTE — Plan of Care (Signed)
  Problem: Education: Goal: Knowledge of risk factors and measures for prevention of condition will improve Outcome: Progressing   Problem: Coping: Goal: Psychosocial and spiritual needs will be supported Outcome: Progressing   Problem: Respiratory: Goal: Will maintain a patent airway Outcome: Progressing Goal: Complications related to the disease process, condition or treatment will be avoided or minimized Outcome: Progressing   

## 2019-05-20 NOTE — Progress Notes (Signed)
   05/20/19 1600  Family/Significant Other Communication  Family/Significant Other Update Other (Comment) (Patient updated family. No call needed per patinet.)

## 2019-05-21 LAB — CBC WITH DIFFERENTIAL/PLATELET
Abs Immature Granulocytes: 0.99 10*3/uL — ABNORMAL HIGH (ref 0.00–0.07)
Basophils Absolute: 0.1 10*3/uL (ref 0.0–0.1)
Basophils Relative: 1 %
Eosinophils Absolute: 0.4 10*3/uL (ref 0.0–0.5)
Eosinophils Relative: 2 %
HCT: 39.5 % (ref 36.0–46.0)
Hemoglobin: 13.3 g/dL (ref 12.0–15.0)
Immature Granulocytes: 6 %
Lymphocytes Relative: 4 %
Lymphs Abs: 0.7 10*3/uL (ref 0.7–4.0)
MCH: 31.7 pg (ref 26.0–34.0)
MCHC: 33.7 g/dL (ref 30.0–36.0)
MCV: 94 fL (ref 80.0–100.0)
Monocytes Absolute: 0.7 10*3/uL (ref 0.1–1.0)
Monocytes Relative: 4 %
Neutro Abs: 13.6 10*3/uL — ABNORMAL HIGH (ref 1.7–7.7)
Neutrophils Relative %: 83 %
Platelets: 322 10*3/uL (ref 150–400)
RBC: 4.2 MIL/uL (ref 3.87–5.11)
RDW: 13.2 % (ref 11.5–15.5)
WBC: 16.5 10*3/uL — ABNORMAL HIGH (ref 4.0–10.5)
nRBC: 0.1 % (ref 0.0–0.2)

## 2019-05-21 LAB — BASIC METABOLIC PANEL
Anion gap: 15 (ref 5–15)
BUN: 87 mg/dL — ABNORMAL HIGH (ref 8–23)
CO2: 25 mmol/L (ref 22–32)
Calcium: 9 mg/dL (ref 8.9–10.3)
Chloride: 92 mmol/L — ABNORMAL LOW (ref 98–111)
Creatinine, Ser: 1.37 mg/dL — ABNORMAL HIGH (ref 0.44–1.00)
GFR calc Af Amer: 42 mL/min — ABNORMAL LOW (ref >60)
GFR calc non Af Amer: 36 mL/min — ABNORMAL LOW (ref >60)
Glucose, Bld: 146 mg/dL — ABNORMAL HIGH (ref 70–99)
Potassium: 4.3 mmol/L (ref 3.5–5.1)
Sodium: 132 mmol/L — ABNORMAL LOW (ref 135–145)

## 2019-05-21 LAB — GLUCOSE, CAPILLARY
Glucose-Capillary: 269 mg/dL — ABNORMAL HIGH (ref 70–99)
Glucose-Capillary: 166 mg/dL — ABNORMAL HIGH (ref 70–99)
Glucose-Capillary: 214 mg/dL — ABNORMAL HIGH (ref 70–99)
Glucose-Capillary: 312 mg/dL — ABNORMAL HIGH (ref 70–99)

## 2019-05-21 LAB — D-DIMER, QUANTITATIVE: D-Dimer, Quant: 1.64 ug/mL-FEU — ABNORMAL HIGH (ref 0.00–0.50)

## 2019-05-21 LAB — C-REACTIVE PROTEIN: CRP: 1 mg/dL — ABNORMAL HIGH (ref <1.0)

## 2019-05-21 MED ORDER — PREDNISONE 20 MG PO TABS
30.0000 mg | ORAL_TABLET | Freq: Every day | ORAL | Status: DC
  Administered 2019-05-21: 30 mg via ORAL
  Filled 2019-05-21: qty 1

## 2019-05-21 MED ORDER — PREDNISONE 20 MG PO TABS
20.0000 mg | ORAL_TABLET | Freq: Every day | ORAL | Status: DC
  Administered 2019-05-22 – 2019-05-23 (×2): 20 mg via ORAL
  Filled 2019-05-21 (×2): qty 1

## 2019-05-21 MED ORDER — POTASSIUM CHLORIDE CRYS ER 20 MEQ PO TBCR
40.0000 meq | EXTENDED_RELEASE_TABLET | Freq: Once | ORAL | Status: AC
  Administered 2019-05-21: 40 meq via ORAL
  Filled 2019-05-21: qty 2

## 2019-05-21 MED ORDER — HYDROCOD POLST-CPM POLST ER 10-8 MG/5ML PO SUER
5.0000 mL | Freq: Two times a day (BID) | ORAL | Status: DC
  Administered 2019-05-21 – 2019-05-26 (×11): 5 mL via ORAL
  Filled 2019-05-21 (×11): qty 5

## 2019-05-21 MED ORDER — FUROSEMIDE 10 MG/ML IJ SOLN
40.0000 mg | Freq: Once | INTRAMUSCULAR | Status: DC
  Filled 2019-05-21: qty 4

## 2019-05-21 MED ORDER — FUROSEMIDE 20 MG PO TABS
80.0000 mg | ORAL_TABLET | Freq: Once | ORAL | Status: AC
  Administered 2019-05-21: 80 mg via ORAL
  Filled 2019-05-21: qty 4

## 2019-05-21 NOTE — Progress Notes (Signed)
PROGRESS NOTE                                                                                                                                                                                                             Patient Demographics:    Virginia Chan, is a 83 y.o. female, DOB - 1936/05/31, [id number]  Outpatient Primary MD for the patient is Street, Virginia Mt, MD    LOS - 7  Admit date - 05/14/2019    CC - SOB     Brief Narrative  Patient is a 83 y.o. highly functional female (still works) with PMHx of hypertension-presented with approximately 1 week history of fatigue and malaise-and 2-3-day history of worsening shortness of breath.  She is evaluated at [facility]-she was positive for COVID-19-she was found to have severe hypoxemia requiring NRB in the setting of COVID-19 pneumonia.   Subjective:   Patient in bed, appears comfortable, denies any headache, no fever, no chest pain or pressure,  improved shortness of breath , no abdominal pain. No focal weakness.    Assessment  & Plan :     1. Acute Hypoxic Resp. Failure due to Acute Covid 19 Viral Pneumonitis during the ongoing 2020 Covid 19 Pandemic - she had severe disease , she has been started on IV steroids and remdesivir, also received convalescent plasma and Actemra.  Gradual improvement.  Now between 4 to 7 L nasal cannula oxygen with probe appropriately fitted, continue to monitor closely, clinically improving especially after Lasix dose.  Continue tapering steroids which have been now switched to oral, gentle diuresis, advance activity and prepare for discharge if improves and oxygen continues to titrate down.  Encouraged her to sit up in chair in the daytime use I-S and flutter valve for pulmonary toiletry and then prone in bed when at night.  SpO2: 92 % O2 Flow Rate (L/min): 10 L/min  Recent Labs  Lab 05/14/19 1755 05/15/19 0006 05/16/19 0553  05/17/19 0229 05/18/19 0355 05/19/19 0100 05/20/19 0301 05/21/19 0520  CRP 19.2* 19.1* 9.4* 5.9* 3.6* 2.6* 1.2* 1.0*  DDIMER 1.22* 0.97* 1.69* 1.81* 1.59* 1.52* 1.66* 1.64*  FERRITIN 1,177* 1,227* 1,181* 1,255* 846* 960*  --   --   BNP 103.9*  --   --  52.8 68.9 46.5 50.6  --   PROCALCITON  --   --   --  0.10 <0.10 0.13  --   --     Hepatic Function Latest Ref Rng & Units 05/20/2019 05/19/2019 05/18/2019  Total Protein 6.5 - 8.1 g/dL 6.3(L) 6.6 6.4(L)  Albumin 3.5 - 5.0 g/dL 3.1(L) 3.2(L) 3.0(L)  AST 15 - 41 U/L 27 30 32  ALT 0 - 44 U/L _0 Alk Phosphatase 38 - 126 U/L 106 115 109  Total Bilirubin 0.3 - 1.2 mg/dL 0.8 0.5 1.1      2.  Leukocytosis.  Is due to steroid use, stable chest x-ray, UA and procalcitonin.  Afebrile.  3.  Minimally elevated LFTs.  Likely due to COVID-19.  Symptom-free trend.  4.  Essential hypertension.  Stable on Norvasc, will add low-dose oral hydralazine for better control.  5.  Few crackles on exam.  IV Lasix repeat on 05/21/2019 .  She is much improved after diuresis.  Will recommend PCP to get an outpatient echocardiogram.  6.  Hyperkalemia.  Steroid related, resolved after Lasix and Kayexalate.  7.  AKI versus CKD 3.  No baseline renal function in chart, seems to be stable around creatinine of 1.4.    Condition - Extremely Guarded  Family Communication  :  Son  12/7, husband 12/8, 12/9  Code Status :  Full  Diet :   Diet Order            Diet heart healthy/carb modified Room service appropriate? Yes; Fluid consistency: Thin  Diet effective now               Disposition Plan  :  Home in am if better  Consults  :  None  Procedures  :     PUD Prophylaxis : PPI  DVT Prophylaxis  :    Heparin    Lab Results  Component Value Date   PLT 322 05/21/2019    Inpatient Medications  Scheduled Meds: . amLODipine  5 mg Oral Daily  . chlorpheniramine-HYDROcodone  5 mL Oral Q12H  . furosemide  40 mg Intravenous Once  .  gabapentin  300 mg Oral BID  . heparin injection (subcutaneous)  7,500 Units Subcutaneous Q8H  . hydrALAZINE  50 mg Oral Q8H  . insulin aspart  0-9 Units Subcutaneous TID WC  . mouth rinse  15 mL Mouth Rinse BID  . pantoprazole  40 mg Oral Daily  . [START ON 05/22/2019] predniSONE  20 mg Oral Q breakfast  . vitamin C  500 mg Oral Daily  . vitamin E  1,000 Units Oral Daily  . zinc sulfate  220 mg Oral Daily   Continuous Infusions:  PRN Meds:.acetaminophen, guaiFENesin-dextromethorphan, hydrALAZINE, [DISCONTINUED] ondansetron **OR** ondansetron (ZOFRAN) IV, polyethylene glycol, sodium chloride, traMADol  Antibiotics  :    Anti-infectives (From admission, onward)   Start     Dose/Rate Route Frequency Ordered Stop   05/17/19 1000  remdesivir 100 mg in sodium chloride 0.9 % 100 mL IVPB     100 mg 200 mL/hr over 30 Minutes Intravenous Daily 05/16/19 2124 05/18/19 0945   05/15/19 1000  remdesivir 100 mg in sodium chloride 0.9 % 250 mL IVPB  Status:  Discontinued     100 mg 500 mL/hr over 30 Minutes Intravenous Every 24 hours 05/14/19 1708 05/16/19 2123       Time Spent in minutes  30   Virginia Chan M.D on 05/21/2019 at 11:36 AM  To page go to [email or web address] - password [email or web address]  [facility] -  Office  [phone]  See all Orders from today for further details    Objective:   Vitals:   05/21/19 0044 05/21/19 0420 05/21/19 0600 05/21/19 0800  BP: (!) 136/57 (!) 162/60  108/83  Pulse: 85 92 88 97  Resp: _0 (!) 24  Temp: 97.8 F (36.6 C) 97.8 F (36.6 C)  98 F (36.7 C)  TempSrc: Oral Oral  Oral  SpO2: 96% 91% 93% 92%  Weight:      Height:        Wt Readings from Last 3 Encounters:  05/14/19 54.7 kg     Intake/Output Summary (Last 24 hours) at 05/21/2019 1136 Last data filed at 05/21/2019 1000 Gross per 24 hour  Intake 660 ml  Output 1500 ml  Net -840 ml   Physical Exam  Awake Alert,   No new F.N deficits, Normal affect [place].AT,PERRAL Supple Neck,No  JVD, No cervical lymphadenopathy appriciated.  Symmetrical Chest wall movement, Good air movement bilaterally, +ve rales RRR,No Gallops, Rubs or new Murmurs, No Parasternal Heave +ve B.Sounds, Abd Soft, No tenderness, No organomegaly appriciated, No rebound - guarding or rigidity. No Cyanosis, Clubbing or edema, No new Rash or bruise   Data Review:    CBC Recent Labs  Lab 05/17/19 0229 05/18/19 0355 05/19/19 0100 05/20/19 0301 05/21/19 0520  WBC 19.4* 21.1* 18.9* 17.2* 16.5*  HGB 11.7* 12.0 12.7 12.7 13.3  HCT 35.2* 35.8* 37.9 37.4 39.5  PLT 356 376 398 364 322  MCV 95.4 94.5 93.8 94.7 94.0  MCH 31.7 31.7 31.4 32.2 31.7  MCHC 33.2 33.5 33.5 34.0 33.7  RDW 13.7 13.4 13.3 13.2 13.2  LYMPHSABS 2.1 2.2 1.3 0.9 0.7  MONOABS 1.2* 1.3* 1.0 0.8 0.7  EOSABS 0.0 0.0 0.1 0.2 0.4  BASOSABS 0.1 0.2* 0.2* 0.1 0.1    Chemistries  Recent Labs  Lab 05/16/19 0553 05/17/19 0229 05/18/19 0355 05/19/19 0100 05/20/19 0301 05/21/19 0520  NA 137 140 137 138 131* 132*  K 4.3 5.1 5.3* 3.9 3.6 4.3  CL 104 105 100 99 93* 92*  CO2 19* _1 GLUCOSE 200* 201* 168* 164* 167* 146*  BUN 75* 65* 71* 73* 75* 87*  CREATININE 1.38* 1.19* 1.45* 1.36* 1.31* 1.37*  CALCIUM 8.6* 9.0 9.0 9.0 8.9 9.0  MG  --   --  2.2 2.2 2.4  --   AST 49* 44* 32 30 27  --   ALT 64* _2 --   ALKPHOS 98 114 109 115 106  --   BILITOT 0.5 0.7 1.1 0.5 0.8  --    ------------------------------------------------------------------------------------------------------------------ No results for input(s): CHOL, HDL, LDLCALC, TRIG, CHOLHDL, LDLDIRECT in the last 72 hours.  Lab Results  Component Value Date   HGBA1C 6.7 (H) 05/14/2019   ------------------------------------------------------------------------------------------------------------------ No results for input(s): TSH, T4TOTAL, T3FREE, THYROIDAB in the last 72 hours.  Invalid input(s): FREET3  Cardiac Enzymes No results for input(s): CKMB,  TROPONINI, MYOGLOBIN in the last 168 hours.  Invalid input(s): CK ------------------------------------------------------------------------------------------------------------------    Component Value Date/Time   BNP 50.6 05/20/2019 0301    Micro Results Recent Results (from the past 240 hour(s))  Culture, Urine     Status: None   Collection Time: 05/18/19  7:30 AM   Specimen: Urine, Random  Result Value Ref Range Status   Specimen Description   Final    URINE, RANDOM Performed at [facility] [address], [place], [place] [address]    Special Requests  Final    NONE Performed at [facility], [place] [address], [place], [place] [address]    Culture   Final    NO GROWTH Performed at [facility], [place] [address] [place], [place] [address]    Report Status 05/19/2019 FINAL  Final    Radiology Reports Cxr Am  Result Date: 05/17/2019 CLINICAL DATA:  Short of breath EXAM: PORTABLE CHEST 1 VIEW COMPARISON:  05/14/2019 FINDINGS: Decreased density of perihilar airspace disease compared to prior. Mild central airspace disease as remain. No pleural fluid. No pneumothorax. IMPRESSION: 1. Improvement in perihilar airspace disease. 2. Mild central airspace disease remains. Electronically Signed   By: Suzy Bouchard M.D.   On: 05/17/2019 11:32

## 2019-05-21 NOTE — Progress Notes (Signed)
   05/21/19 1606  Family/Significant Other Communication  Family/Significant Other Update Other (Comment) (Patient updated family.)

## 2019-05-21 NOTE — Progress Notes (Signed)
Spoke to floor RN, will get to [facility] as soon as we can to start IV.

## 2019-05-21 NOTE — Progress Notes (Signed)
Straight cath performed with Sarah RN. 1163ml clear urine drained from bladder.

## 2019-05-21 NOTE — Progress Notes (Signed)
Physical Therapy Treatment Patient Details Name: Virginia Chan MRN: [id number] DOB: 04/19/1936 Today's Date: 05/21/2019    History of Present Illness Pt is an 83 y.o. female admitted 05/14/19 with worsening malaise, fever and myalgia. Found to have hyoxpia needing NRB; (+) COVID-19 with multifocal PNA. PMH includes HTN.   PT Comments    Pt slowly progressing with mobility. Requires min-modA for mobility with RW, only able to tolerate walking 8' total, requiring prolonged seated rest breaks between standing mobility. Pt reports d/c home tomorrow; declining post-acute rehab at CIR/SNF, stating family will provide 24/7 assist. Educ pt on assist for all standing and ADL tasks, will need w/c as she can only walk 4' at a time; pt called son Virginia Chan) at end of session who confirmed he can get DME and family can provide necessary assist. Will continue to follow acutely.  SpO2 down to low 80s on 4L O2 [place], returning to 88-92% with prolonged seated rest on 4L   Follow Up Recommendations  CIR;Supervision/Assistance - 24 hour(pt declined CIR/SNF, will need HHPT)     Equipment Recommendations  None recommended by PT(son to provide RW, 3in1, wheelchair)    Recommendations for Other Services       Precautions / Restrictions Precautions Precautions: Fall Precaution Comments: desats easily, watch HR Restrictions Weight Bearing Restrictions: No    Mobility  Bed Mobility Overal bed mobility: Needs Assistance Bed Mobility: Sit to Supine       Sit to supine: Modified independent (Device/Increase time)      Transfers Overall transfer level: Needs assistance Equipment used: 1 person hand held assist;Rolling walker (2 wheeled) Transfers: Sit to/from Stand Sit to Stand: Min assist;Mod assist         General transfer comment: Initial standing with HHA requiring min-modA to maintain balance; multiple repeated trials with RW requiring consistent minA for balance  Ambulation/Gait Ambulation/Gait  assistance: Min assist Gait Distance (Feet): 8 Feet Assistive device: Rolling walker (2 wheeled) Gait Pattern/deviations: Step-to pattern;Trunk flexed;Leaning posteriorly Gait velocity: Decreased Gait velocity interpretation: <1.31 ft/sec, indicative of household ambulator General Gait Details: Amb 4' + 4' with RW and chair follow for seated rest, minA for stability, cues for sequencing with RW; additional steps from chair to bed. SpO2 down to 80s on 5L O2 [place] requiring prolonged seated rest break to recover   Stairs             Wheelchair Mobility    Modified Rankin (Stroke Patients Only)       Balance Overall balance assessment: Needs assistance Sitting-balance support: Feet supported Sitting balance-Leahy Scale: Fair     Standing balance support: During functional activity;Single extremity supported Standing balance-Leahy Scale: Poor Standing balance comment: reliant on min-mod A from therapist for support                            Cognition Arousal/Alertness: Awake/alert Behavior During Therapy: [facility] for tasks assessed/performed;Anxious Overall Cognitive Status: Impaired/Different from baseline Area of Impairment: Memory;Attention;Safety/judgement;Awareness;Problem solving                   Current Attention Level: Sustained;Selective Memory: Decreased short-term memory   Safety/Judgement: Decreased awareness of deficits Awareness: Emergent Problem Solving: Requires verbal cues General Comments: Improved anxiety, but appreciates encouragement and repeating therapist's instruction. Poor insight into current condition and physical assist needs; reports MD to d/c tomorrow and stating agreement with need for SNF/assist but then talking about going home with family assist  Exercises      General Comments General comments (skin integrity, edema, etc.): Pt reports d/c home tomorrow, declining post-acute rehab with family to provide 24/7 assist;  pt called son Virginia Chan) at end of session who will be able to get RW, w/c and 3in1 and states family to provide necessary assist      Pertinent Vitals/Pain Pain Assessment: No/denies pain    Home Living                      Prior Function            PT Goals (current goals can now be found in the care plan section) Progress towards PT goals: Progressing toward goals    Frequency    Min 3X/week      PT Plan Current plan remains appropriate    Co-evaluation              AM-PAC PT "6 Clicks" Mobility   Outcome Measure  Help needed turning from your back to your side while in a flat bed without using bedrails?: None Help needed moving from lying on your back to sitting on the side of a flat bed without using bedrails?: None Help needed moving to and from a bed to a chair (including a wheelchair)?: A Little Help needed standing up from a chair using your arms (e.g., wheelchair or bedside chair)?: A Little Help needed to walk in hospital room?: A Little Help needed climbing 3-5 steps with a railing? : A Lot 6 Click Score: 19    End of Session Equipment Utilized During Treatment: Oxygen Activity Tolerance: Patient tolerated treatment well;Patient limited by fatigue Patient left: in bed;with call bell/phone within reach Nurse Communication: Mobility status PT Visit Diagnosis: Other abnormalities of gait and mobility (R26.89);Muscle weakness (generalized) (M62.81)     Time: 1321-1350 PT Time Calculation (min) (ACUTE ONLY): 29 min  Charges:  $Therapeutic Exercise: 8-22 mins $Self Care/Home Management: 8-22                    Mabeline Caras, PT, DPT Acute Rehabilitation Services  Pager [phone] Office [phone]  Derry Lory 05/21/2019, 2:04 PM

## 2019-05-21 NOTE — Progress Notes (Signed)
At 0600, bladder scan completed showing 547 ml retaining. Call out to dr Lona Millard. Orders in place for in/out catheter. IN/out cath completed 700 ml of urine drained. Pt tolerated well. Pt stable with no signs of distress. All safety precautions implemented. Continuing to monitor pt.

## 2019-05-21 NOTE — Progress Notes (Signed)
   05/21/19 1849 05/21/19 1918  Urine Characteristics  Bladder Scan Volume (mL) 999 mL  --   Intermittent/Straight Cath (mL)  --  1100 mL  Post Void Cath Residual (mL)  --  0 mL

## 2019-05-22 LAB — COMPREHENSIVE METABOLIC PANEL
ALT: 31 U/L (ref 0–44)
AST: 39 U/L (ref 15–41)
Albumin: 3.1 g/dL — ABNORMAL LOW (ref 3.5–5.0)
Alkaline Phosphatase: 106 U/L (ref 38–126)
Anion gap: 15 (ref 5–15)
BUN: 77 mg/dL — ABNORMAL HIGH (ref 8–23)
CO2: 25 mmol/L (ref 22–32)
Calcium: 9 mg/dL (ref 8.9–10.3)
Chloride: 92 mmol/L — ABNORMAL LOW (ref 98–111)
Creatinine, Ser: 1.53 mg/dL — ABNORMAL HIGH (ref 0.44–1.00)
GFR calc Af Amer: 37 mL/min — ABNORMAL LOW (ref >60)
GFR calc non Af Amer: 32 mL/min — ABNORMAL LOW (ref >60)
Glucose, Bld: 181 mg/dL — ABNORMAL HIGH (ref 70–99)
Potassium: 4.8 mmol/L (ref 3.5–5.1)
Sodium: 132 mmol/L — ABNORMAL LOW (ref 135–145)
Total Bilirubin: 0.7 mg/dL (ref 0.3–1.2)
Total Protein: 6.5 g/dL (ref 6.5–8.1)

## 2019-05-22 LAB — GLUCOSE, CAPILLARY
Glucose-Capillary: 142 mg/dL — ABNORMAL HIGH (ref 70–99)
Glucose-Capillary: 244 mg/dL — ABNORMAL HIGH (ref 70–99)
Glucose-Capillary: 291 mg/dL — ABNORMAL HIGH (ref 70–99)
Glucose-Capillary: 326 mg/dL — ABNORMAL HIGH (ref 70–99)
Glucose-Capillary: 174 mg/dL — ABNORMAL HIGH (ref 70–99)

## 2019-05-22 MED ORDER — DILTIAZEM HCL 25 MG/5ML IV SOLN
10.0000 mg | Freq: Four times a day (QID) | INTRAVENOUS | Status: DC | PRN
  Administered 2019-05-23: 10 mg via INTRAVENOUS
  Filled 2019-05-22 (×2): qty 5

## 2019-05-22 MED ORDER — FUROSEMIDE 20 MG PO TABS
20.0000 mg | ORAL_TABLET | Freq: Once | ORAL | Status: AC
  Administered 2019-05-22: 20 mg via ORAL
  Filled 2019-05-22: qty 1

## 2019-05-22 MED ORDER — SENNOSIDES-DOCUSATE SODIUM 8.6-50 MG PO TABS
1.0000 | ORAL_TABLET | Freq: Every day | ORAL | Status: DC | PRN
  Administered 2019-05-22 – 2019-05-23 (×2): 1 via ORAL
  Filled 2019-05-22 (×2): qty 1

## 2019-05-22 MED ORDER — DILTIAZEM HCL 60 MG PO TABS
60.0000 mg | ORAL_TABLET | Freq: Two times a day (BID) | ORAL | Status: DC
  Administered 2019-05-22 – 2019-05-26 (×9): 60 mg via ORAL
  Filled 2019-05-22 (×11): qty 1

## 2019-05-22 MED ORDER — ENSURE ENLIVE PO LIQD
237.0000 mL | Freq: Two times a day (BID) | ORAL | Status: DC
  Administered 2019-05-22 – 2019-05-23 (×3): 237 mL via ORAL

## 2019-05-22 MED ORDER — CHLORHEXIDINE GLUCONATE CLOTH 2 % EX PADS
6.0000 | MEDICATED_PAD | Freq: Every day | CUTANEOUS | Status: DC
  Administered 2019-05-22 – 2019-05-23 (×2): 6 via TOPICAL

## 2019-05-22 NOTE — Plan of Care (Signed)
  Problem: Education: Goal: Knowledge of risk factors and measures for prevention of condition will improve Outcome: Progressing   Problem: Coping: Goal: Psychosocial and spiritual needs will be supported Outcome: Progressing   Problem: Respiratory: Goal: Will maintain a patent airway Outcome: Progressing Goal: Complications related to the disease process, condition or treatment will be avoided or minimized Outcome: Progressing   

## 2019-05-22 NOTE — Progress Notes (Signed)
Straight cath completed 400 ml  Of clear urine drained.

## 2019-05-22 NOTE — Progress Notes (Addendum)
Physical Therapy Treatment Patient Details Name: Virginia Chan MRN: [id number] DOB: 05-18-1936 Today's Date: 05/22/2019    History of Present Illness Pt is an 83 y.o. female admitted 05/14/19 with worsening malaise, fever and myalgia. Found to have hyoxpia needing NRB; (+) COVID-19 with multifocal PNA. PMH includes HTN.   PT Comments    Pt progressing with mobility. Able to increase gait distance with RW and intermittent minA, requires prolonged seated rest to recover SOB and hypoxia with minimal activity on 5L O2 (see values below). Pt plans to d/c home with assist from family; reports son able to acquire necessary DME. Will continue to follow acutely.  HR 110s-137 SpO2 down to 83% on 5L O2 (via forehead nelcor probe) Pt requiring seated rest/deep breathing and 6L to reach 90%   Follow Up Recommendations  CIR;Supervision/Assistance - 24 hour(declined CIR/SNF; will need [place] services)     Equipment Recommendations  None recommended by PT(son to provide RW, 3in1, w/c)    Recommendations for Other Services       Precautions / Restrictions Precautions Precautions: Fall Precaution Comments: desats easily, watch HR Restrictions Weight Bearing Restrictions: No    Mobility  Bed Mobility               General bed mobility comments: Received sitting in recliner  Transfers Overall transfer level: Needs assistance Equipment used: Rolling walker (2 wheeled) Transfers: Sit to/from Stand Sit to Stand: Min assist;Min guard         General transfer comment: Multiple sit<>stand transfers from recliner to RW, 1x cue for hand placement, initial minA for balance, progressing to min guard subsequent trials  Ambulation/Gait Ambulation/Gait assistance: Min assist;Min guard Gait Distance (Feet): 32 Feet Assistive device: Rolling walker (2 wheeled) Gait Pattern/deviations: Step-to pattern;Trunk flexed;Leaning posteriorly;Step-through pattern Gait velocity: Decreased Gait velocity  interpretation: <1.31 ft/sec, indicative of household ambulator General Gait Details: Amb 14' forwards/backwards with RW and min guard, minA to prevent LOB with backwards walking; prolonged seated rest break, then amb additional 18' forwards with RW and min guard. Stability improved when cued to take longer steps. SpO2 down to 83% on 5L (via nelcor forehead probe)   Stairs             Wheelchair Mobility    Modified Rankin (Stroke Patients Only)       Balance Overall balance assessment: Needs assistance Sitting-balance support: Feet supported Sitting balance-Leahy Scale: Fair     Standing balance support: During functional activity;Single extremity supported Standing balance-Leahy Scale: Poor Standing balance comment: Reliant on UE support                            Cognition Arousal/Alertness: Awake/alert Behavior During Therapy: WFL for tasks assessed/performed;Anxious Overall Cognitive Status: No family/caregiver present to determine baseline cognitive functioning Area of Impairment: Attention;Safety/judgement;Awareness;Problem solving                   Current Attention Level: Selective     Safety/Judgement: Decreased awareness of deficits Awareness: Emergent Problem Solving: Requires verbal cues        Exercises      General Comments General comments (skin integrity, edema, etc.): SpO2 down to 83% on 5L O2 (via nelcor forehead probe), requiring prolonged seated rest and 6L to return to 90%      Pertinent Vitals/Pain Pain Assessment: Faces Faces Pain Scale: Hurts a little bit Pain Location: Chest with breathing Pain Descriptors / Indicators: Discomfort Pain Intervention(s): Monitored  during session    Home Living                      Prior Function            PT Goals (current goals can now be found in the care plan section) Progress towards PT goals: Progressing toward goals    Frequency    Min 3X/week       PT Plan Current plan remains appropriate    Co-evaluation              AM-PAC PT "6 Clicks" Mobility   Outcome Measure  Help needed turning from your back to your side while in a flat bed without using bedrails?: None Help needed moving from lying on your back to sitting on the side of a flat bed without using bedrails?: A Little Help needed moving to and from a bed to a chair (including a wheelchair)?: A Little Help needed standing up from a chair using your arms (e.g., wheelchair or bedside chair)?: A Little Help needed to walk in hospital room?: A Little Help needed climbing 3-5 steps with a railing? : A Lot 6 Click Score: 18    End of Session Equipment Utilized During Treatment: Oxygen Activity Tolerance: Patient tolerated treatment well;Patient limited by fatigue Patient left: in chair;with call bell/phone within reach;with chair alarm set Nurse Communication: Mobility status PT Visit Diagnosis: Other abnormalities of gait and mobility (R26.89);Muscle weakness (generalized) (M62.81)     Time: [id number] PT Time Calculation (min) (ACUTE ONLY): 25 min  Charges:  $Therapeutic Exercise: 8-22 mins $Therapeutic Activity: 8-22 mins                    Mabeline Caras, PT, DPT Acute Rehabilitation Services  Pager [phone] Office [place] 05/22/2019, 1:16 PM

## 2019-05-22 NOTE — Progress Notes (Signed)
Initial Nutrition Assessment  DOCUMENTATION CODES:      INTERVENTION:  Recommend liberalize diet to Soft-CHO modified  Obtain re-weight for comparison   Increase Ensure to BID and continue Magic cups with lunch and dinner  NUTRITION DIAGNOSIS:   Inadequate oral intake related to poor appetite, acute illness(COVID-19 Pneumonia) as evidenced by energy intake < or equal to 50% for > or equal to 5 days.   GOAL:   Patient will meet greater than or equal to 90% of their needs MONITOR:   PO intake, Supplement acceptance, Weight trends, Labs, I & O's REASON FOR ASSESSMENT:      ASSESSMENT:  Patient is an 83 yo female with a hx of hypertension. Presents with shortness of breath and malaise. Experiencing fever and muscle weakness 1 week prior to admission. Acute hypoxic respiratory failure COVID positive on 12/2. Slowly improving but requiring more oxygen today.    Meal intake is poor per chart review: 0-30% only 1 meal  documented at 50%.  LOS day 8. Meeting < 50 % of energy needs for 5 or more days. Patient at risk for malnutrition.   Limited wt history. Only initial weight obtained. Need re-wt to assess percent change the past week.  Medications reviewed and include: IV lasix ,prednisone, insulin, Protonix, vitamin C, zinc, Vitamin E (1,000 units)   Intake/Output Summary (Last 24 hours) at 05/22/2019 1454 Last data filed at 05/22/2019 0900 Gross per 24 hour  Intake 360 ml  Output 1501 ml  Net -1141 ml   Since admission- she is (-3.65 liters)  Labs: BMP Latest Ref Rng & Units 05/22/2019 05/21/2019 05/20/2019  Glucose 70 - 99 mg/dL 181(H) 146(H) 167(H)  BUN 8 - 23 mg/dL 77(H) 87(H) 75(H)  Creatinine 0.44 - 1.00 mg/dL 1.53(H) 1.37(H) 1.31(H)  Sodium 135 - 145 mmol/L 132(L) 132(L) 131(L)  Potassium 3.5 - 5.1 mmol/L 4.8 4.3 3.6  Chloride 98 - 111 mmol/L 92(L) 92(L) 93(L)  CO2 22 - 32 mmol/L 25 25 26   Calcium 8.9 - 10.3 mg/dL 9.0 9.0 8.9     Diet Order:   Diet Order       Diet heart healthy/carb modified Room service appropriate? Yes; Fluid consistency: Thin  Diet effective now              EDUCATION NEEDS:   No education needs have been identified at this time Skin:  Skin Assessment: Reviewed RN Assessment- dry tenting  Last BM:  12/6  Height:   Ht Readings from Last 1 Encounters:  05/14/19 5\' 4"  (1.626 m)    Weight:   Wt Readings from Last 1 Encounters:  05/14/19 54.7 kg    Ideal Body Weight:  55 kg  BMI:  Body mass index is 20.7 kg/m.  Estimated Nutritional Needs:   Kcal:  [id number]  Protein:  72-82 gr  Fluid:  >1500   Colman Cater MS,RD,CSG,LDN Office: [phone] Pager: [phone]

## 2019-05-22 NOTE — Progress Notes (Signed)
Inpatient Diabetes Program Recommendations  AACE/ADA: New Consensus Statement on Inpatient Glycemic Control (2015)  Target Ranges:  Prepandial:   less than 140 mg/dL      Peak postprandial:   less than 180 mg/dL (1-2 hours)      Critically ill patients:  140 - 180 mg/dL   Lab Results  Component Value Date   GLUCAP 142 (H) 05/22/2019   HGBA1C 6.7 (H) 05/14/2019    Review of Glycemic Control Results for Virginia Chan, Virginia Chan (MRN [id number]) as of 05/22/2019 11:24  Ref. Range 05/21/2019 11:30 05/21/2019 15:53 05/21/2019 20:39 05/22/2019 08:21  Glucose-Capillary Latest Ref Range: 70 - 99 mg/dL 166 (H) 214 (H) 312 (H) 142 (H)    Current orders for Inpatient glycemic control: Novolog 0-9 units TID Prednisone 20 mg QD  Inpatient Diabetes Program Recommendations:    Consider adding Novolog 0-5 units qhs.   Thanks, Bronson Curb, MSN, RNC-OB Diabetes Coordinator [phone] (8a-5p)

## 2019-05-22 NOTE — Progress Notes (Signed)
Foley cath inserted with Sarah RN at bedside. Patient tolerated well. 565ml yellow, clear urine excreted.    05/22/19 1620 05/22/19 1626  Urethral Catheter Kisha RN Straight-tip 14 Fr.  Placement Date/Time: 05/22/19 1624   Perineal care performed prior to insertion?: Yes  Person Inserting Catheter: Kisha RN  Person Assisting with Catheter Insertion: Lucita Ferrara RN  Patient Location at Time of Insertion [facility], Department, Room Number):...  Output (mL)  --  500 mL  Urine Characteristics  Urinary Incontinence  --  No  Urine Color  --  Yellow/straw  Urine Appearance  --  Clear  Bladder Scan Volume (mL) 423 mL  --

## 2019-05-22 NOTE — Progress Notes (Signed)
PROGRESS NOTE                                                                                                                                                                                                             Patient Demographics:    Virginia Chan, is a 83 y.o. female, DOB - Dec 03, 1935, [id number]  Outpatient Primary MD for the patient is Street, [name], MD    LOS - 8  Admit date - 05/14/2019    CC - SOB     Brief Narrative  Patient is a 83 y.o. highly functional female (still works) with PMHx of hypertension-presented with approximately 1 week history of fatigue and malaise-and 2-3-day history of worsening shortness of breath.  She is evaluated at [facility] health-she was positive for COVID-19-she was found to have severe hypoxemia requiring NRB in the setting of COVID-19 pneumonia.   Subjective:   Patient in bed, appears comfortable, denies any headache, no fever, no chest pain or pressure,  improved shortness of breath , no abdominal pain. No focal weakness.    Assessment  & Plan :     1. Acute Hypoxic Resp. Failure due to Acute Covid 19 Viral Pneumonitis during the ongoing 2020 Covid 19 Pandemic - she had severe disease , she has been started on IV steroids and remdesivir, also received convalescent plasma and Actemra.  Gradual improvement.  She had improved but on 05/22/2019 looks slightly more short of breath, will go up on oxygen, continue diuresis, continue steroids and monitor closely.  Still remains tenuous.  Encouraged her to sit up in chair in the daytime use I-S and flutter valve for pulmonary toiletry and then prone in bed when at night.  SpO2: 92 % O2 Flow Rate (L/min): 4 L/min  Recent Labs  Lab 05/16/19 0553 05/17/19 0229 05/18/19 0355 05/19/19 0100 05/20/19 0301 05/21/19 0520  CRP 9.4* 5.9* 3.6* 2.6* 1.2* 1.0*  DDIMER 1.69* 1.81* 1.59* 1.52* 1.66* 1.64*  FERRITIN 1,181* 1,255* 846*  960*  --   --   BNP  --  52.8 68.9 46.5 50.6  --   PROCALCITON  --  0.10 <0.10 0.13  --   --     Hepatic Function Latest Ref Rng & Units 05/22/2019 05/20/2019 05/19/2019  Total Protein 6.5 - 8.1 g/dL 6.5 6.3(L) 6.6  Albumin 3.5 - 5.0  g/dL 3.1(L) 3.1(L) 3.2(L)  AST 15 - 41 U/L 39 27 30  ALT 0 - 44 U/L _0 Alk Phosphatase 38 - 126 U/L 106 106 115  Total Bilirubin 0.3 - 1.2 mg/dL 0.7 0.8 0.5      2.  Leukocytosis.  Is due to steroid use, stable chest x-ray, UA and procalcitonin.  Afebrile.  3.  Minimally elevated LFTs.  Likely due to COVID-19.  Resolved.  4.  Essential hypertension.  Stable on Norvasc, will add low-dose oral hydralazine for better control.  5.  Few crackles on exam.  IV Lasix repeat on 05/22/2019 .  She is much improved after diuresis.  Will recommend PCP to get an outpatient echocardiogram.  6.  Hyperkalemia.  Steroid related, resolved after Lasix and Kayexalate.  7.  AKI versus CKD 3.  No baseline renal function in chart, seems to be stable around creatinine of 1.4.    Condition - Extremely Guarded  Family Communication  :    Son  12/7, husband 12/8, 12/9.    Son [name] on 05/22/2019 explained tenuous situation.  He understands that she is very sick.  He would want short-term life support if needed.  Code Status :  Full  Diet :   Diet Order            Diet heart healthy/carb modified Room service appropriate? Yes; Fluid consistency: Thin  Diet effective now               Disposition Plan  :  Home in am if better  Consults  :  None  Procedures  :     PUD Prophylaxis : PPI  DVT Prophylaxis  :    Heparin    Lab Results  Component Value Date   PLT 322 05/21/2019    Inpatient Medications  Scheduled Meds: . chlorpheniramine-HYDROcodone  5 mL Oral Q12H  . diltiazem  60 mg Oral Q12H  . gabapentin  300 mg Oral BID  . heparin injection (subcutaneous)  7,500 Units Subcutaneous Q8H  . hydrALAZINE  50 mg Oral Q8H  . insulin aspart  0-9  Units Subcutaneous TID WC  . mouth rinse  15 mL Mouth Rinse BID  . pantoprazole  40 mg Oral Daily  . predniSONE  20 mg Oral Q breakfast  . vitamin C  500 mg Oral Daily  . vitamin E  1,000 Units Oral Daily  . zinc sulfate  220 mg Oral Daily   Continuous Infusions:  PRN Meds:.acetaminophen, diltiazem, guaiFENesin-dextromethorphan, hydrALAZINE, [DISCONTINUED] ondansetron **OR** ondansetron (ZOFRAN) IV, polyethylene glycol, sodium chloride, traMADol  Antibiotics  :    Anti-infectives (From admission, onward)   Start     Dose/Rate Route Frequency Ordered Stop   05/17/19 1000  remdesivir 100 mg in sodium chloride 0.9 % 100 mL IVPB     100 mg 200 mL/hr over 30 Minutes Intravenous Daily 05/16/19 2124 05/18/19 0945   05/15/19 1000  remdesivir 100 mg in sodium chloride 0.9 % 250 mL IVPB  Status:  Discontinued     100 mg 500 mL/hr over 30 Minutes Intravenous Every 24 hours 05/14/19 1708 05/16/19 2123       Time Spent in minutes  30   Lala Lund M.D on 05/22/2019 at 10:30 AM  To page go to [email or web address] - password [facility]  [facility] -  Office  [phone]   See all Orders from today for further details    Objective:   Vitals:   05/22/19 0418  05/22/19 0700 05/22/19 0710 05/22/19 0800  BP:    (!) 136/51  Pulse: 86   92  Resp: 15   20  Temp:    97.6 F (36.4 C)  TempSrc:    Oral  SpO2: 94% 94% 95% 92%  Weight:      Height:        Wt Readings from Last 3 Encounters:  05/14/19 54.7 kg     Intake/Output Summary (Last 24 hours) at 05/22/2019 1030 Last data filed at 05/22/2019 0900 Gross per 24 hour  Intake 360 ml  Output 1502 ml  Net -1142 ml   Physical Exam  Awake Alert,   No new F.N deficits, Normal affect [place].AT,PERRAL Supple Neck,No JVD, No cervical lymphadenopathy appriciated.  Symmetrical Chest wall movement, Good air movement bilaterally,+ve rales RRR,No Gallops, Rubs or new Murmurs, No Parasternal Heave +ve B.Sounds, Abd Soft, No tenderness, No  organomegaly appriciated, No rebound - guarding or rigidity. No Cyanosis, Clubbing or edema, No new Rash or bruise    Data Review:    CBC Recent Labs  Lab 05/17/19 0229 05/18/19 0355 05/19/19 0100 05/20/19 0301 05/21/19 0520  WBC 19.4* 21.1* 18.9* 17.2* 16.5*  HGB 11.7* 12.0 12.7 12.7 13.3  HCT 35.2* 35.8* 37.9 37.4 39.5  PLT 356 376 398 364 322  MCV 95.4 94.5 93.8 94.7 94.0  MCH 31.7 31.7 31.4 32.2 31.7  MCHC 33.2 33.5 33.5 34.0 33.7  RDW 13.7 13.4 13.3 13.2 13.2  LYMPHSABS 2.1 2.2 1.3 0.9 0.7  MONOABS 1.2* 1.3* 1.0 0.8 0.7  EOSABS 0.0 0.0 0.1 0.2 0.4  BASOSABS 0.1 0.2* 0.2* 0.1 0.1    Chemistries  Recent Labs  Lab 05/17/19 0229 05/18/19 0355 05/19/19 0100 05/20/19 0301 05/21/19 0520 05/22/19 0245  NA 140 137 138 131* 132* 132*  K 5.1 5.3* 3.9 3.6 4.3 4.8  CL 105 100 99 93* 92* 92*  CO2 _0 GLUCOSE 201* 168* 164* 167* 146* 181*  BUN 65* 71* 73* 75* 87* 77*  CREATININE 1.19* 1.45* 1.36* 1.31* 1.37* 1.53*  CALCIUM 9.0 9.0 9.0 8.9 9.0 9.0  MG  --  2.2 2.2 2.4  --   --   AST 44* 32 30 27  --  39  ALT _1 --  31  ALKPHOS 114 109 115 106  --  106  BILITOT 0.7 1.1 0.5 0.8  --  0.7   ------------------------------------------------------------------------------------------------------------------ No results for input(s): CHOL, HDL, LDLCALC, TRIG, CHOLHDL, LDLDIRECT in the last 72 hours.  Lab Results  Component Value Date   HGBA1C 6.7 (H) 05/14/2019   ------------------------------------------------------------------------------------------------------------------ No results for input(s): TSH, T4TOTAL, T3FREE, THYROIDAB in the last 72 hours.  Invalid input(s): FREET3  Cardiac Enzymes No results for input(s): CKMB, TROPONINI, MYOGLOBIN in the last 168 hours.  Invalid input(s): CK ------------------------------------------------------------------------------------------------------------------    Component Value Date/Time   BNP 50.6  05/20/2019 0301    Micro Results Recent Results (from the past 240 hour(s))  Culture, Urine     Status: None   Collection Time: 05/18/19  7:30 AM   Specimen: Urine, Random  Result Value Ref Range Status   Specimen Description   Final    URINE, RANDOM Performed at [place] [address] [place], [place] [address]    Special Requests   Final    NONE Performed at [facility], [place] [address], [place], [place] [address]    Culture   Final  NO GROWTH Performed at [facility], [place] [address], [place], [place] [address]    Report Status 05/19/2019 FINAL  Final    Radiology Reports CXR am  Result Date: 05/17/2019 CLINICAL DATA:  Short of breath EXAM: PORTABLE CHEST 1 VIEW COMPARISON:  05/14/2019 FINDINGS: Decreased density of perihilar airspace disease compared to prior. Mild central airspace disease as remain. No pleural fluid. No pneumothorax. IMPRESSION: 1. Improvement in perihilar airspace disease. 2. Mild central airspace disease remains. Electronically Signed   By: Suzy Bouchard M.D.   On: 05/17/2019 11:32

## 2019-05-22 NOTE — Progress Notes (Signed)
   05/22/19 1723  Family/Significant Other Communication  Family/Significant Other Update Other (Comment) (Patient updating family.)

## 2019-05-23 ENCOUNTER — Inpatient Hospital Stay (HOSPITAL_COMMUNITY): Payer: PPO

## 2019-05-23 LAB — CBC WITH DIFFERENTIAL/PLATELET
Abs Immature Granulocytes: 0.68 10*3/uL — ABNORMAL HIGH (ref 0.00–0.07)
Basophils Absolute: 0.1 10*3/uL (ref 0.0–0.1)
Basophils Relative: 0 %
Eosinophils Absolute: 0.5 10*3/uL (ref 0.0–0.5)
Eosinophils Relative: 3 %
HCT: 38.2 % (ref 36.0–46.0)
Hemoglobin: 12.9 g/dL (ref 12.0–15.0)
Immature Granulocytes: 5 %
Lymphocytes Relative: 8 %
Lymphs Abs: 1.1 10*3/uL (ref 0.7–4.0)
MCH: 32 pg (ref 26.0–34.0)
MCHC: 33.8 g/dL (ref 30.0–36.0)
MCV: 94.8 fL (ref 80.0–100.0)
Monocytes Absolute: 0.8 10*3/uL (ref 0.1–1.0)
Monocytes Relative: 6 %
Neutro Abs: 11 10*3/uL — ABNORMAL HIGH (ref 1.7–7.7)
Neutrophils Relative %: 78 %
Platelets: 281 10*3/uL (ref 150–400)
RBC: 4.03 MIL/uL (ref 3.87–5.11)
RDW: 12.9 % (ref 11.5–15.5)
WBC: 14.1 10*3/uL — ABNORMAL HIGH (ref 4.0–10.5)
nRBC: 0 % (ref 0.0–0.2)

## 2019-05-23 LAB — COMPREHENSIVE METABOLIC PANEL
ALT: 30 U/L (ref 0–44)
AST: 35 U/L (ref 15–41)
Albumin: 3.3 g/dL — ABNORMAL LOW (ref 3.5–5.0)
Alkaline Phosphatase: 99 U/L (ref 38–126)
Anion gap: 14 (ref 5–15)
BUN: 80 mg/dL — ABNORMAL HIGH (ref 8–23)
CO2: 26 mmol/L (ref 22–32)
Calcium: 8.8 mg/dL — ABNORMAL LOW (ref 8.9–10.3)
Chloride: 91 mmol/L — ABNORMAL LOW (ref 98–111)
Creatinine, Ser: 1.6 mg/dL — ABNORMAL HIGH (ref 0.44–1.00)
GFR calc Af Amer: 35 mL/min — ABNORMAL LOW (ref >60)
GFR calc non Af Amer: 30 mL/min — ABNORMAL LOW (ref >60)
Glucose, Bld: 129 mg/dL — ABNORMAL HIGH (ref 70–99)
Potassium: 4.5 mmol/L (ref 3.5–5.1)
Sodium: 131 mmol/L — ABNORMAL LOW (ref 135–145)
Total Bilirubin: 1.2 mg/dL (ref 0.3–1.2)
Total Protein: 6.2 g/dL — ABNORMAL LOW (ref 6.5–8.1)

## 2019-05-23 LAB — GLUCOSE, CAPILLARY
Glucose-Capillary: 115 mg/dL — ABNORMAL HIGH (ref 70–99)
Glucose-Capillary: 254 mg/dL — ABNORMAL HIGH (ref 70–99)
Glucose-Capillary: 304 mg/dL — ABNORMAL HIGH (ref 70–99)
Glucose-Capillary: 329 mg/dL — ABNORMAL HIGH (ref 70–99)

## 2019-05-23 LAB — MAGNESIUM: Magnesium: 2.4 mg/dL (ref 1.7–2.4)

## 2019-05-23 MED ORDER — FUROSEMIDE 10 MG/ML IJ SOLN: 40.0000 mg | Freq: Once | INTRAMUSCULAR | Status: DC

## 2019-05-23 MED ORDER — MAGNESIUM HYDROXIDE 400 MG/5ML PO SUSP
30.0000 mL | Freq: Two times a day (BID) | ORAL | Status: DC
  Administered 2019-05-24: 30 mL via ORAL
  Filled 2019-05-23 (×2): qty 30

## 2019-05-23 MED ORDER — DOCUSATE SODIUM 100 MG PO CAPS
100.0000 mg | ORAL_CAPSULE | Freq: Two times a day (BID) | ORAL | Status: DC
  Administered 2019-05-24 – 2019-05-26 (×5): 100 mg via ORAL
  Filled 2019-05-23 (×6): qty 1

## 2019-05-23 MED ORDER — PREDNISONE 10 MG PO TABS: 10.0000 mg | ORAL_TABLET | Freq: Every day | ORAL | Status: DC

## 2019-05-23 MED ORDER — TAMSULOSIN HCL 0.4 MG PO CAPS
0.4000 mg | ORAL_CAPSULE | Freq: Every day | ORAL | Status: DC
  Administered 2019-05-23 – 2019-05-26 (×4): 0.4 mg via ORAL
  Filled 2019-05-23 (×4): qty 1

## 2019-05-23 NOTE — Plan of Care (Signed)
  Problem: Respiratory: Goal: Complications related to the disease process, condition or treatment will be avoided or minimized Outcome: Not Progressing  Remains on O2, though weaned. Continues to desat with activity. O2 monitored.

## 2019-05-23 NOTE — Progress Notes (Signed)
PROGRESS NOTE                                                                                                                                                                                                             Patient Demographics:    Virginia Chan, is a 83 y.o. female, DOB - 1935-10-14, UXL:[id number]  Outpatient Primary MD for the patient is Street, [name], MD    LOS - 9  Admit date - 05/14/2019    CC - SOB     Brief Narrative  Patient is a 83 y.o. highly functional female (still works) with PMHx of hypertension-presented with approximately 1 week history of fatigue and malaise-and 2-3-day history of worsening shortness of breath.  She is evaluated at [facility] health-she was positive for COVID-19-she was found to have severe hypoxemia requiring NRB in the setting of COVID-19 pneumonia.   Subjective:   Patient in bed, appears comfortable, denies any headache, no fever, no chest pain or pressure,  improved shortness of breath , no abdominal pain. No focal weakness.    Assessment  & Plan :     1. Acute Hypoxic Resp. Failure due to Acute Covid 19 Viral Pneumonitis during the ongoing 2020 Covid 19 Pandemic - she had severe disease , she has been started on IV steroids and remdesivir, also received convalescent plasma and Actemra.  Also requiring IV Lasix, finally she is doing better on 05/23/2019 and now on 3-1/2 L of nasal cannula oxygen and feeling better, will continue to monitor another day if stable discharge on 05/24/2019.  Encouraged her to sit up in chair in the daytime use I-S and flutter valve for pulmonary toiletry and then prone in bed when at night.  SpO2: 94 % O2 Flow Rate (L/min): 3.5 L/min  Recent Labs  Lab 05/17/19 0229 05/18/19 0355 05/19/19 0100 05/20/19 0301 05/21/19 0520  CRP 5.9* 3.6* 2.6* 1.2* 1.0*  DDIMER 1.81* 1.59* 1.52* 1.66* 1.64*  FERRITIN 1,255* 846* 960*  --   --   BNP 52.8  68.9 46.5 50.6  --   PROCALCITON 0.10 <0.10 0.13  --   --     Hepatic Function Latest Ref Rng & Units 05/23/2019 05/22/2019 05/20/2019  Total Protein 6.5 - 8.1 g/dL 6.2(L) 6.5 6.3(L)  Albumin 3.5 - 5.0 g/dL 3.3(L) 3.1(L) 3.1(L)  AST 15 -  41 U/L 35 39 27  ALT 0 - 44 U/L _0 Alk Phosphatase 38 - 126 U/L 99 106 106  Total Bilirubin 0.3 - 1.2 mg/dL 1.2 0.7 0.8      2.  Leukocytosis.  Is due to steroid use, stable chest x-ray, UA and procalcitonin.  Afebrile.  3.  Minimally elevated LFTs.  Likely due to COVID-19.  Resolved.  4.  Essential hypertension.  Stable on Norvasc, will add low-dose oral hydralazine for better control.  5.  Few crackles on exam.  IV Lasix repeat on 05/22/2019 .  She is much improved after diuresis.  Will recommend PCP to get an outpatient echocardiogram.  6.  Hyperkalemia.  Steroid related, resolved after Lasix and Kayexalate.  7.  AKI versus CKD 3.  No baseline renal function in chart, seems to be stable around creatinine of 1.4.  8.  Urinary retention.  On Foley and Flomax, will try and removing Foley this evening.    Condition - Extremely Guarded  Family Communication  :    Son  12/7, husband 12/8, 12/9.    Son [name] on 05/22/2019 explained tenuous situation.  He understands that she is very sick.  He would want short-term life support if needed. 12/11, 12/12  Code Status :  Full  Diet :   Diet Order            Diet heart healthy/carb modified Room service appropriate? Yes; Fluid consistency: Thin  Diet effective now               Disposition Plan  :  Home in am if better  Consults  :  None  Procedures  :     PUD Prophylaxis : PPI  DVT Prophylaxis  :    Heparin    Lab Results  Component Value Date   PLT 281 05/23/2019    Inpatient Medications  Scheduled Meds: . Chlorhexidine Gluconate Cloth  6 each Topical Daily  . chlorpheniramine-HYDROcodone  5 mL Oral Q12H  . diltiazem  60 mg Oral Q12H  . feeding supplement (ENSURE  ENLIVE)  237 mL Oral BID BM  . gabapentin  300 mg Oral BID  . heparin injection (subcutaneous)  7,500 Units Subcutaneous Q8H  . hydrALAZINE  50 mg Oral Q8H  . insulin aspart  0-9 Units Subcutaneous TID WC  . mouth rinse  15 mL Mouth Rinse BID  . pantoprazole  40 mg Oral Daily  . [START ON 05/24/2019] predniSONE  10 mg Oral Q breakfast  . tamsulosin  0.4 mg Oral Daily  . vitamin C  500 mg Oral Daily  . vitamin E  1,000 Units Oral Daily  . zinc sulfate  220 mg Oral Daily   Continuous Infusions:  PRN Meds:.acetaminophen, diltiazem, guaiFENesin-dextromethorphan, hydrALAZINE, [DISCONTINUED] ondansetron **OR** ondansetron (ZOFRAN) IV, polyethylene glycol, senna-docusate, sodium chloride, traMADol  Antibiotics  :    Anti-infectives (From admission, onward)   Start     Dose/Rate Route Frequency Ordered Stop   05/17/19 1000  remdesivir 100 mg in sodium chloride 0.9 % 100 mL IVPB     100 mg 200 mL/hr over 30 Minutes Intravenous Daily 05/16/19 2124 05/18/19 0945   05/15/19 1000  remdesivir 100 mg in sodium chloride 0.9 % 250 mL IVPB  Status:  Discontinued     100 mg 500 mL/hr over 30 Minutes Intravenous Every 24 hours 05/14/19 1708 05/16/19 2123       Time Spent in minutes  30   Prashant  Candiss Norse M.D on 05/23/2019 at 11:09 AM  To page go to [email or web address] - password [facility]  [facility] -  Office  [phone]   See all Orders from today for further details    Objective:   Vitals:   05/23/19 0740 05/23/19 0750 05/23/19 0835 05/23/19 0840  BP:    (!) 137/49  Pulse:      Resp:    18  Temp:      TempSrc:      SpO2: 98% 94% 94%   Weight:      Height:        Wt Readings from Last 3 Encounters:  05/14/19 54.7 kg     Intake/Output Summary (Last 24 hours) at 05/23/2019 1109 Last data filed at 05/23/2019 0559 Gross per 24 hour  Intake 860 ml  Output 1200 ml  Net -340 ml   Physical Exam  Awake Alert,  , No new F.N deficits, Normal affect [place].AT,PERRAL Supple Neck,No  JVD, No cervical lymphadenopathy appriciated.  Symmetrical Chest wall movement, Good air movement bilaterally, CTAB RRR,No Gallops, Rubs or new Murmurs, No Parasternal Heave +ve B.Sounds, Abd Soft, No tenderness, No organomegaly appriciated, No rebound - guarding or rigidity. No Cyanosis, Clubbing or edema, No new Rash or bruise     Data Review:    CBC Recent Labs  Lab 05/18/19 0355 05/19/19 0100 05/20/19 0301 05/21/19 0520 05/23/19 0323  WBC 21.1* 18.9* 17.2* 16.5* 14.1*  HGB 12.0 12.7 12.7 13.3 12.9  HCT 35.8* 37.9 37.4 39.5 38.2  PLT 376 398 364 322 281  MCV 94.5 93.8 94.7 94.0 94.8  MCH 31.7 31.4 32.2 31.7 32.0  MCHC 33.5 33.5 34.0 33.7 33.8  RDW 13.4 13.3 13.2 13.2 12.9  LYMPHSABS 2.2 1.3 0.9 0.7 1.1  MONOABS 1.3* 1.0 0.8 0.7 0.8  EOSABS 0.0 0.1 0.2 0.4 0.5  BASOSABS 0.2* 0.2* 0.1 0.1 0.1    Chemistries  Recent Labs  Lab 05/18/19 0355 05/19/19 0100 05/20/19 0301 05/21/19 0520 05/22/19 0245 05/23/19 0323  NA 137 138 131* 132* 132* 131*  K 5.3* 3.9 3.6 4.3 4.8 4.5  CL 100 99 93* 92* 92* 91*  CO2 _0 GLUCOSE 168* 164* 167* 146* 181* 129*  BUN 71* 73* 75* 87* 77* 80*  CREATININE 1.45* 1.36* 1.31* 1.37* 1.53* 1.60*  CALCIUM 9.0 9.0 8.9 9.0 9.0 8.8*  MG 2.2 2.2 2.4  --   --  2.4  AST 32 30 27  --  39 35  ALT _1 --  31 30  ALKPHOS 109 115 106  --  106 99  BILITOT 1.1 0.5 0.8  --  0.7 1.2   ------------------------------------------------------------------------------------------------------------------ No results for input(s): CHOL, HDL, LDLCALC, TRIG, CHOLHDL, LDLDIRECT in the last 72 hours.  Lab Results  Component Value Date   HGBA1C 6.7 (H) 05/14/2019   ------------------------------------------------------------------------------------------------------------------ No results for input(s): TSH, T4TOTAL, T3FREE, THYROIDAB in the last 72 hours.  Invalid input(s): FREET3  Cardiac Enzymes No results for input(s): CKMB,  TROPONINI, MYOGLOBIN in the last 168 hours.  Invalid input(s): CK ------------------------------------------------------------------------------------------------------------------    Component Value Date/Time   BNP 50.6 05/20/2019 0301    Micro Results Recent Results (from the past 240 hour(s))  Culture, Urine     Status: None   Collection Time: 05/18/19  7:30 AM   Specimen: Urine, Random  Result Value Ref Range Status   Specimen Description   Final    URINE, RANDOM Performed at [facility]  [facility], [place] [address] [place], [place] [address]    Special Requests   Final    NONE Performed at [facility], [place] [address], [place], [place] [address]    Culture   Final    NO GROWTH Performed at [facility], [place] [address] [place], [place] [address]    Report Status 05/19/2019 FINAL  Final    Radiology Reports DG Chest Port 1 View  Result Date: 05/23/2019 CLINICAL DATA:  Shortness of breath.  COVID-19. EXAM: PORTABLE CHEST 1 VIEW COMPARISON:  05/17/2019 FINDINGS: Midline trachea. Normal heart size. Atherosclerosis in the transverse aorta. No pleural effusion or pneumothorax. Biapical pleural thickening. Given differences in technique, similar patchy interstitial opacities bilaterally. Relative sparing of the right lung base. Mild right hemidiaphragm elevation. Surgical clips in the right upper quadrant. IMPRESSION: Similar bilateral interstitial opacities, most consistent with COVID-19 pneumonia. Aortic Atherosclerosis (ICD10-I70.0). Electronically Signed   By: Abigail Miyamoto M.D.   On: 05/23/2019 08:41   CXR am  Result Date: 05/17/2019 CLINICAL DATA:  Short of breath EXAM: PORTABLE CHEST 1 VIEW COMPARISON:  05/14/2019 FINDINGS: Decreased density of perihilar airspace disease compared to prior. Mild central airspace disease as remain. No pleural fluid. No pneumothorax. IMPRESSION: 1. Improvement in perihilar airspace disease. 2. Mild central  airspace disease remains. Electronically Signed   By: Suzy Bouchard M.D.   On: 05/17/2019 11:32

## 2019-05-23 NOTE — TOC Initial Note (Signed)
Transition of Care (TOC) - Initial/Assessment Note  Marvetta Gibbons RN, BSN Transitions of Care Unit 4E- RN Case Manager ([place] coverage) [phone]   Patient Details  Name: CYDNEY LAFLAMME MRN: [id number] Date of Birth: 1935-10-07  Transition of Care [facility]) CM/SW Contact:    Dawayne Patricia, RN Phone Number: 05/23/2019, 5:00 PM  Clinical Narrative:                 Pt admitted with +COVID from home with spouse- spouse also +COVID and was discharged home on 12/8. - pt will need home 02 for transition home and HHRN/PT- call made to son [name] to discuss transition plan/needs- Per TC son states he has needed DME at home including w/c, 3n1, rollator - choice offered for [facility] and 02 needs- per son he is open to "whatever agency can get here the quickest" he also reports that [facility] was ordered for his dad but they have not received a call from any agency- will look into this for them- and try to see if same agency can be used for both. Son states he will plan to transport pt home via car. Call made to Learta Codding with [employer] for home 02 needs- will plan on having home equipment delivered to home later today for planned d/c tomorrow- (will need to notify [facility] in am to have portable tank from [place] taken to room for discharge) Discovered that [facility] was one that was contacted for pt's spouse regarding [facility] services however per Tommi Rumps with [facility] they do not have staffing in that area to start services for 2 weeks. Calls made to [facility]- who can not accept either, and [facility]- who is able to accept both patients with start of care next week by Wednesday- call made back to son to see if that would work- and son states he is ok with [facility] for middle of next week. [facility] will accept referral for both this pt and her spouse who was discharged earlier in the week.   Expected Discharge Plan: [place] Barriers to Discharge: Barriers Resolved   Patient Goals and CMS Choice Patient states their goals for  this hospitalization and ongoing recovery are:: return home CMS Medicare.gov Compare Post Acute Care list provided to:: Patient Represenative (must comment)(son [name]) Choice offered to / list presented to : Adult Children  Expected Discharge Plan and Services Expected Discharge Plan: [place]   Discharge Planning Services: CM Consult Post Acute Care Choice: Home Health, Durable Medical Equipment Living arrangements for the past 2 months: Single Family Home                 DME Arranged: Oxygen DME Agency: [place] Date DME Agency Contacted: 05/23/19 Time DME Agency Contacted: 39 Representative spoke with at DME Agency: Learta Codding HH Arranged: RN, PT [facility] Agency: [facility] Date [place]: 05/23/19 Time [place]: 30 Representative spoke with at [place]: [place]  Prior Living Arrangements/Services Living arrangements for the past 2 months: [place] with:: Spouse Patient language and need for interpreter reviewed:: Yes Do you feel safe going back to the place where you live?: Yes      Need for Family Participation in Patient Care: Yes (Comment) Care giver support system in place?: Yes (comment) Current home services: DME Criminal Activity/Legal Involvement Pertinent to Current Situation/Hospitalization: No - Comment as needed  Activities of Daily Living Home Assistive Devices/Equipment: Eyeglasses ADL Screening (condition at time  of admission) Patient's cognitive ability adequate to safely complete daily activities?: Yes Is the patient deaf or have difficulty hearing?: No Does the patient have difficulty seeing, even when wearing glasses/contacts?: No Does the patient have difficulty concentrating, remembering, or making decisions?: No Patient able to express need for assistance with ADLs?: No Does the patient have difficulty dressing or bathing?: No Independently performs ADLs?: Yes (appropriate for  developmental age) Does the patient have difficulty walking or climbing stairs?: No Weakness of Legs: None Weakness of Arms/Hands: None  Permission Sought/Granted Permission sought to share information with : Facility Art therapist granted to share information with : Yes, Verbal Permission Granted     Permission granted to share info w AGENCY: [place] agency        Emotional Assessment   Attitude/Demeanor/Rapport: Unable to Assess Affect (typically observed): Unable to Assess     Psych Involvement: No (comment)  Admission diagnosis:  PNEUMONIA HYPOXIA COVID 19 VIRUS INFECTION Patient Active Problem List   Diagnosis Date Noted  . Acute respiratory failure with hypoxia ([place]) 05/14/2019  . Pneumonia due to COVID-19 virus 05/14/2019  . Benign essential HTN 05/14/2019   PCP:  Street, [name], MD Pharmacy:   [facility], [place] [address] Phone: [phone] Fax: [phone]     Social Determinants of Health (SDOH) Interventions    Readmission Risk Interventions No flowsheet data found.

## 2019-05-23 NOTE — Progress Notes (Signed)
Results for AL, NOURY (MRN [id number]) as of 05/23/2019 15:09  Ref. Range 05/22/2019 16:39 05/22/2019 20:05 05/22/2019 23:23 05/23/2019 07:23 05/23/2019 11:31  Glucose-Capillary Latest Ref Range: 70 - 99 mg/dL 291 (H) 326 (H) 174 (H) 115 (H) 254 (H)  Noted that postprandial blood sugars are greater than 180 mg/dl. Getting meal supplements, not eating meals.  Recommend increasing Novolog correction scale to MODERATE TID if blood sugars continue to be elevated with meal supplements. Consider adding Novolog HS scale.   Harvel Ricks RN BSN CDE Diabetes Coordinator Pager: [phone]  8am-5pm

## 2019-05-23 NOTE — Progress Notes (Signed)
1454: Foley cath removed.

## 2019-05-24 LAB — CBC WITH DIFFERENTIAL/PLATELET
Abs Immature Granulocytes: 0.76 10*3/uL — ABNORMAL HIGH (ref 0.00–0.07)
Basophils Absolute: 0.1 10*3/uL (ref 0.0–0.1)
Basophils Relative: 0 %
Eosinophils Absolute: 0.3 10*3/uL (ref 0.0–0.5)
Eosinophils Relative: 2 %
HCT: 34.8 % — ABNORMAL LOW (ref 36.0–46.0)
Hemoglobin: 11.9 g/dL — ABNORMAL LOW (ref 12.0–15.0)
Immature Granulocytes: 5 %
Lymphocytes Relative: 7 %
Lymphs Abs: 1.2 10*3/uL (ref 0.7–4.0)
MCH: 31.9 pg (ref 26.0–34.0)
MCHC: 34.2 g/dL (ref 30.0–36.0)
MCV: 93.3 fL (ref 80.0–100.0)
Monocytes Absolute: 1 10*3/uL (ref 0.1–1.0)
Monocytes Relative: 6 %
Neutro Abs: 13.7 10*3/uL — ABNORMAL HIGH (ref 1.7–7.7)
Neutrophils Relative %: 80 %
Platelets: 259 10*3/uL (ref 150–400)
RBC: 3.73 MIL/uL — ABNORMAL LOW (ref 3.87–5.11)
RDW: 12.6 % (ref 11.5–15.5)
WBC: 17 10*3/uL — ABNORMAL HIGH (ref 4.0–10.5)
nRBC: 0 % (ref 0.0–0.2)

## 2019-05-24 LAB — COMPREHENSIVE METABOLIC PANEL
ALT: 34 U/L (ref 0–44)
AST: 37 U/L (ref 15–41)
Albumin: 3.2 g/dL — ABNORMAL LOW (ref 3.5–5.0)
Alkaline Phosphatase: 100 U/L (ref 38–126)
Anion gap: 12 (ref 5–15)
BUN: 91 mg/dL — ABNORMAL HIGH (ref 8–23)
CO2: 27 mmol/L (ref 22–32)
Calcium: 9 mg/dL (ref 8.9–10.3)
Chloride: 90 mmol/L — ABNORMAL LOW (ref 98–111)
Creatinine, Ser: 1.58 mg/dL — ABNORMAL HIGH (ref 0.44–1.00)
GFR calc Af Amer: 35 mL/min — ABNORMAL LOW (ref >60)
GFR calc non Af Amer: 30 mL/min — ABNORMAL LOW (ref >60)
Glucose, Bld: 128 mg/dL — ABNORMAL HIGH (ref 70–99)
Potassium: 4.9 mmol/L (ref 3.5–5.1)
Sodium: 129 mmol/L — ABNORMAL LOW (ref 135–145)
Total Bilirubin: 0.8 mg/dL (ref 0.3–1.2)
Total Protein: 6.1 g/dL — ABNORMAL LOW (ref 6.5–8.1)

## 2019-05-24 LAB — GLUCOSE, CAPILLARY
Glucose-Capillary: 111 mg/dL — ABNORMAL HIGH (ref 70–99)
Glucose-Capillary: 199 mg/dL — ABNORMAL HIGH (ref 70–99)
Glucose-Capillary: 238 mg/dL — ABNORMAL HIGH (ref 70–99)
Glucose-Capillary: 220 mg/dL — ABNORMAL HIGH (ref 70–99)

## 2019-05-24 LAB — MAGNESIUM: Magnesium: 2.3 mg/dL (ref 1.7–2.4)

## 2019-05-24 MED ORDER — CEPHALEXIN 500 MG PO CAPS
500.0000 mg | ORAL_CAPSULE | Freq: Three times a day (TID) | ORAL | Status: DC
  Administered 2019-05-24 – 2019-05-26 (×6): 500 mg via ORAL
  Filled 2019-05-24 (×11): qty 1

## 2019-05-24 MED ORDER — PREDNISONE 10 MG PO TABS
5.0000 mg | ORAL_TABLET | Freq: Every day | ORAL | Status: DC
  Administered 2019-05-24: 5 mg via ORAL
  Filled 2019-05-24: qty 1

## 2019-05-24 MED ORDER — SODIUM CHLORIDE 0.9 % IV SOLN
INTRAVENOUS | Status: AC
  Administered 2019-05-24: 08:00:00 via INTRAVENOUS

## 2019-05-24 MED ORDER — POLYETHYLENE GLYCOL 3350 17 G PO PACK
17.0000 g | PACK | Freq: Two times a day (BID) | ORAL | Status: DC
  Administered 2019-05-24 – 2019-05-26 (×4): 17 g via ORAL
  Filled 2019-05-24 (×4): qty 1

## 2019-05-24 NOTE — Progress Notes (Signed)
PROGRESS NOTE                                                                                                                                                                                                             Patient Demographics:    Virginia Chan, is a 83 y.o. female, DOB - Oct 20, 1935, [id number]  Outpatient Primary MD for the patient is Street, [name], MD    LOS - 10  Admit date - 05/14/2019    CC - SOB     Brief Narrative  Patient is a 83 y.o. highly functional female (still works) with PMHx of hypertension-presented with approximately 1 week history of fatigue and malaise-and 2-3-day history of worsening shortness of breath.  She is evaluated at [facility] health-she was positive for COVID-19-she was found to have severe hypoxemia requiring NRB in the setting of COVID-19 pneumonia.   Subjective:   Patient in bed, appears comfortable, denies any headache, no fever, no chest pain or pressure, no shortness of breath , no abdominal pain. No focal weakness.   Assessment  & Plan :     1. Acute Hypoxic Resp. Failure due to Acute Covid 19 Viral Pneumonitis during the ongoing 2020 Covid 19 Pandemic - she had severe disease , she has been started on IV steroids and remdesivir, also received convalescent plasma and Actemra.  Also needed IV Lasix, finally she is doing better on 05/23/2019 and now on 2-3 L of nasal cannula oxygen and feeling better, will continue to monitor another day if stable discharge on 05/24/2019.  Encouraged her to sit up in chair in the daytime use I-S and flutter valve for pulmonary toiletry and then prone in bed when at night.  SpO2: 95 % O2 Flow Rate (L/min): 3 L/min  Recent Labs  Lab 05/18/19 0355 05/19/19 0100 05/20/19 0301 05/21/19 0520  CRP 3.6* 2.6* 1.2* 1.0*  DDIMER 1.59* 1.52* 1.66* 1.64*  FERRITIN 846* 960*  --   --   BNP 68.9 46.5 50.6  --   PROCALCITON <0.10 0.13  --   --      Hepatic Function Latest Ref Rng & Units 05/24/2019 05/23/2019 05/22/2019  Total Protein 6.5 - 8.1 g/dL 6.1(L) 6.2(L) 6.5  Albumin 3.5 - 5.0 g/dL 3.2(L) 3.3(L) 3.1(L)  AST 15 - 41 U/L 37 35 39  ALT 0 -  44 U/L 34 30 31  Alk Phosphatase 38 - 126 U/L 100 99 106  Total Bilirubin 0.3 - 1.2 mg/dL 0.8 1.2 0.7      2.  Leukocytosis.  Is due to steroid use, stable chest x-ray, UA and procalcitonin.  Afebrile.  3.  Minimally elevated LFTs.  Likely due to COVID-19.  Resolved.  4.  Essential hypertension.  Stable on Norvasc, will add low-dose oral hydralazine for better control.  5.  Few crackles on exam.  IV Lasix repeat on 05/22/2019 .  She is much improved after diuresis.  Will recommend PCP to get an outpatient echocardiogram.  6.  Hyperkalemia.  Steroid related, resolved after Lasix and Kayexalate.  7.  AKI versus CKD 3 with Hyponatremia .  No baseline renal function in chart, seems to be stable around creatinine of 1.4. IVF on 12/12.  8.  Urinary retention.  On Foley and Flomax, will try and removing Foley this evening.    Condition - Extremely Guarded  Family Communication  :    Son  12/7, husband 12/8, 12/9.    Son Virginia Chan on 05/22/2019 explained tenuous situation.  He understands that she is very sick.  He would want short-term life support if needed. 12/11, 12/12  Code Status :  Full  Diet :   Diet Order            Diet heart healthy/carb modified Room service appropriate? Yes; Fluid consistency: Thin  Diet effective now               Disposition Plan  :  Home in am if renal function better  Consults  :  None  Procedures  :     PUD Prophylaxis : PPI  DVT Prophylaxis  :    Heparin    Lab Results  Component Value Date   PLT 259 05/24/2019    Inpatient Medications  Scheduled Meds: . chlorpheniramine-HYDROcodone  5 mL Oral Q12H  . diltiazem  60 mg Oral Q12H  . docusate sodium  100 mg Oral BID  . gabapentin  300 mg Oral BID  . heparin injection  (subcutaneous)  7,500 Units Subcutaneous Q8H  . insulin aspart  0-9 Units Subcutaneous TID WC  . mouth rinse  15 mL Mouth Rinse BID  . pantoprazole  40 mg Oral Daily  . polyethylene glycol  17 g Oral BID  . tamsulosin  0.4 mg Oral Daily  . vitamin C  500 mg Oral Daily  . vitamin E  1,000 Units Oral Daily  . zinc sulfate  220 mg Oral Daily   Continuous Infusions: . sodium chloride 75 mL/hr at 05/24/19 0808   PRN Meds:.acetaminophen, diltiazem, guaiFENesin-dextromethorphan, hydrALAZINE, [DISCONTINUED] ondansetron **OR** ondansetron (ZOFRAN) IV, senna-docusate, sodium chloride, traMADol  Antibiotics  :    Anti-infectives (From admission, onward)   Start     Dose/Rate Route Frequency Ordered Stop   05/17/19 1000  remdesivir 100 mg in sodium chloride 0.9 % 100 mL IVPB     100 mg 200 mL/hr over 30 Minutes Intravenous Daily 05/16/19 2124 05/18/19 0945   05/15/19 1000  remdesivir 100 mg in sodium chloride 0.9 % 250 mL IVPB  Status:  Discontinued     100 mg 500 mL/hr over 30 Minutes Intravenous Every 24 hours 05/14/19 1708 05/16/19 2123       Time Spent in minutes  30   Lala Lund M.D on 05/24/2019 at 11:13 AM  To page go to [email or web address] - password [email or web address]  [facility] -  Office  [phone]   See all Orders from today for further details    Objective:   Vitals:   05/23/19 2202 05/24/19 0000 05/24/19 0400 05/24/19 0744  BP:  (!) 127/52 (!) 139/47 (!) 108/55  Pulse: 98 96 86 85  Resp: '19 17 16 17  ' Temp:   98.2 F (36.8 C) (!) 97.4 F (36.3 C)  TempSrc:   Oral Oral  SpO2: 92% 94% 92% 95%  Weight:      Height:        Wt Readings from Last 3 Encounters:  05/14/19 54.7 kg     Intake/Output Summary (Last 24 hours) at 05/24/2019 1113 Last data filed at 05/24/2019 2778 Gross per 24 hour  Intake 240 ml  Output 900 ml  Net -660 ml   Physical Exam  Awake Alert,  No new F.N deficits, Normal affect [place].AT,PERRAL Supple Neck,No JVD, No cervical  lymphadenopathy appriciated.  Symmetrical Chest wall movement, Good air movement bilaterally, CTAB RRR,No Gallops, Rubs or new Murmurs, No Parasternal Heave +ve B.Sounds, Abd Soft, No tenderness, No organomegaly appriciated, No rebound - guarding or rigidity. No Cyanosis, Clubbing or edema, No new Rash or bruise    Data Review:    CBC Recent Labs  Lab 05/19/19 0100 05/20/19 0301 05/21/19 0520 05/23/19 0323 05/24/19 0311  WBC 18.9* 17.2* 16.5* 14.1* 17.0*  HGB 12.7 12.7 13.3 12.9 11.9*  HCT 37.9 37.4 39.5 38.2 34.8*  PLT 398 364 322 281 259  MCV 93.8 94.7 94.0 94.8 93.3  MCH 31.4 32.2 31.7 32.0 31.9  MCHC 33.5 34.0 33.7 33.8 34.2  RDW 13.3 13.2 13.2 12.9 12.6  LYMPHSABS 1.3 0.9 0.7 1.1 1.2  MONOABS 1.0 0.8 0.7 0.8 1.0  EOSABS 0.1 0.2 0.4 0.5 0.3  BASOSABS 0.2* 0.1 0.1 0.1 0.1    Chemistries  Recent Labs  Lab 05/18/19 0355 05/19/19 0100 05/20/19 0301 05/21/19 0520 05/22/19 0245 05/23/19 0323 05/24/19 0311  NA 137 138 131* 132* 132* 131* 129*  K 5.3* 3.9 3.6 4.3 4.8 4.5 4.9  CL 100 99 93* 92* 92* 91* 90*  CO2 '24 25 26 25 25 26 27  ' GLUCOSE 168* 164* 167* 146* 181* 129* 128*  BUN 71* 73* 75* 87* 77* 80* 91*  CREATININE 1.45* 1.36* 1.31* 1.37* 1.53* 1.60* 1.58*  CALCIUM 9.0 9.0 8.9 9.0 9.0 8.8* 9.0  MG 2.2 2.2 2.4  --   --  2.4 2.3  AST 32 30 27  --  39 35 37  ALT '23 23 21  ' --  31 30 34  ALKPHOS 109 115 106  --  106 99 100  BILITOT 1.1 0.5 0.8  --  0.7 1.2 0.8   ------------------------------------------------------------------------------------------------------------------ No results for input(s): CHOL, HDL, LDLCALC, TRIG, CHOLHDL, LDLDIRECT in the last 72 hours.  Lab Results  Component Value Date   HGBA1C 6.7 (H) 05/14/2019   ------------------------------------------------------------------------------------------------------------------ No results for input(s): TSH, T4TOTAL, T3FREE, THYROIDAB in the last 72 hours.  Invalid input(s): FREET3  Cardiac  Enzymes No results for input(s): CKMB, TROPONINI, MYOGLOBIN in the last 168 hours.  Invalid input(s): CK ------------------------------------------------------------------------------------------------------------------    Component Value Date/Time   BNP 50.6 05/20/2019 0301    Micro Results Recent Results (from the past 240 hour(s))  Culture, Urine     Status: None   Collection Time: 05/18/19  7:30 AM   Specimen: Urine, Random  Result Value Ref Range Status   Specimen Description   Final    URINE, RANDOM  Performed at [facility], [place] [address], [place], [place] [address]    Special Requests   Final    NONE Performed at [facility], [place] [address], [place], [place] [address]    Culture   Final    NO GROWTH Performed at [facility], [place] [address], [place], [place] [address]    Report Status 05/19/2019 FINAL  Final    Radiology Reports DG Chest Port 1 View  Result Date: 05/23/2019 CLINICAL DATA:  Shortness of breath.  COVID-19. EXAM: PORTABLE CHEST 1 VIEW COMPARISON:  05/17/2019 FINDINGS: Midline trachea. Normal heart size. Atherosclerosis in the transverse aorta. No pleural effusion or pneumothorax. Biapical pleural thickening. Given differences in technique, similar patchy interstitial opacities bilaterally. Relative sparing of the right lung base. Mild right hemidiaphragm elevation. Surgical clips in the right upper quadrant. IMPRESSION: Similar bilateral interstitial opacities, most consistent with COVID-19 pneumonia. Aortic Atherosclerosis (ICD10-I70.0). Electronically Signed   By: Abigail Miyamoto M.D.   On: 05/23/2019 08:41   CXR am  Result Date: 05/17/2019 CLINICAL DATA:  Short of breath EXAM: PORTABLE CHEST 1 VIEW COMPARISON:  05/14/2019 FINDINGS: Decreased density of perihilar airspace disease compared to prior. Mild central airspace disease as remain. No pleural fluid. No pneumothorax. IMPRESSION: 1. Improvement in perihilar  airspace disease. 2. Mild central airspace disease remains. Electronically Signed   By: Suzy Bouchard M.D.   On: 05/17/2019 11:32

## 2019-05-24 NOTE — Progress Notes (Signed)
Straight cath completed, 635ml of clear urine drained. Pt stable with no signs of distress. Safety maintained.

## 2019-05-24 NOTE — Plan of Care (Signed)
  Problem: Education: Goal: Knowledge of risk factors and measures for prevention of condition will improve Outcome: Progressing   Problem: Coping: Goal: Psychosocial and spiritual needs will be supported Outcome: Progressing   Problem: Respiratory: Goal: Will maintain a patent airway Outcome: Progressing Goal: Complications related to the disease process, condition or treatment will be avoided or minimized Outcome: Progressing   

## 2019-05-25 LAB — CBC WITH DIFFERENTIAL/PLATELET
Abs Immature Granulocytes: 0.77 10*3/uL — ABNORMAL HIGH (ref 0.00–0.07)
Basophils Absolute: 0.1 10*3/uL (ref 0.0–0.1)
Basophils Relative: 1 %
Eosinophils Absolute: 0.3 10*3/uL (ref 0.0–0.5)
Eosinophils Relative: 2 %
HCT: 32.8 % — ABNORMAL LOW (ref 36.0–46.0)
Hemoglobin: 10.8 g/dL — ABNORMAL LOW (ref 12.0–15.0)
Immature Granulocytes: 5 %
Lymphocytes Relative: 9 %
Lymphs Abs: 1.2 10*3/uL (ref 0.7–4.0)
MCH: 31.4 pg (ref 26.0–34.0)
MCHC: 32.9 g/dL (ref 30.0–36.0)
MCV: 95.3 fL (ref 80.0–100.0)
Monocytes Absolute: 0.8 10*3/uL (ref 0.1–1.0)
Monocytes Relative: 6 %
Neutro Abs: 10.9 10*3/uL — ABNORMAL HIGH (ref 1.7–7.7)
Neutrophils Relative %: 77 %
Platelets: 230 10*3/uL (ref 150–400)
RBC: 3.44 MIL/uL — ABNORMAL LOW (ref 3.87–5.11)
RDW: 12.7 % (ref 11.5–15.5)
WBC: 14.1 10*3/uL — ABNORMAL HIGH (ref 4.0–10.5)
nRBC: 0 % (ref 0.0–0.2)

## 2019-05-25 LAB — COMPREHENSIVE METABOLIC PANEL
ALT: 33 U/L (ref 0–44)
AST: 39 U/L (ref 15–41)
Albumin: 3.1 g/dL — ABNORMAL LOW (ref 3.5–5.0)
Alkaline Phosphatase: 96 U/L (ref 38–126)
Anion gap: 10 (ref 5–15)
BUN: 71 mg/dL — ABNORMAL HIGH (ref 8–23)
CO2: 28 mmol/L (ref 22–32)
Calcium: 8.8 mg/dL — ABNORMAL LOW (ref 8.9–10.3)
Chloride: 89 mmol/L — ABNORMAL LOW (ref 98–111)
Creatinine, Ser: 1.5 mg/dL — ABNORMAL HIGH (ref 0.44–1.00)
GFR calc Af Amer: 37 mL/min — ABNORMAL LOW (ref >60)
GFR calc non Af Amer: 32 mL/min — ABNORMAL LOW (ref >60)
Glucose, Bld: 147 mg/dL — ABNORMAL HIGH (ref 70–99)
Potassium: 5.7 mmol/L — ABNORMAL HIGH (ref 3.5–5.1)
Sodium: 127 mmol/L — ABNORMAL LOW (ref 135–145)
Total Bilirubin: 0.9 mg/dL (ref 0.3–1.2)
Total Protein: 5.7 g/dL — ABNORMAL LOW (ref 6.5–8.1)

## 2019-05-25 LAB — OSMOLALITY: Osmolality: 299 mOsm/kg — ABNORMAL HIGH (ref 275–295)

## 2019-05-25 LAB — OSMOLALITY, URINE: Osmolality, Ur: 453 mOsm/kg (ref 300–900)

## 2019-05-25 LAB — SODIUM, URINE, RANDOM: Sodium, Ur: 44 mmol/L

## 2019-05-25 LAB — GLUCOSE, CAPILLARY
Glucose-Capillary: 122 mg/dL — ABNORMAL HIGH (ref 70–99)
Glucose-Capillary: 238 mg/dL — ABNORMAL HIGH (ref 70–99)
Glucose-Capillary: 240 mg/dL — ABNORMAL HIGH (ref 70–99)

## 2019-05-25 LAB — URIC ACID: Uric Acid, Serum: 7.2 mg/dL — ABNORMAL HIGH (ref 2.5–7.1)

## 2019-05-25 LAB — BRAIN NATRIURETIC PEPTIDE: B Natriuretic Peptide: 69.4 pg/mL (ref 0.0–100.0)

## 2019-05-25 LAB — CREATININE, URINE, RANDOM: Creatinine, Urine: 49.37 mg/dL

## 2019-05-25 LAB — MAGNESIUM: Magnesium: 2.5 mg/dL — ABNORMAL HIGH (ref 1.7–2.4)

## 2019-05-25 MED ORDER — CHLORHEXIDINE GLUCONATE CLOTH 2 % EX PADS
6.0000 | MEDICATED_PAD | Freq: Every day | CUTANEOUS | Status: DC
  Administered 2019-05-25 – 2019-05-26 (×2): 6 via TOPICAL

## 2019-05-25 MED ORDER — FUROSEMIDE 10 MG/ML IJ SOLN
40.0000 mg | Freq: Once | INTRAMUSCULAR | Status: AC
  Administered 2019-05-25: 40 mg via INTRAVENOUS
  Filled 2019-05-25: qty 4

## 2019-05-25 MED ORDER — SODIUM POLYSTYRENE SULFONATE 15 GM/60ML PO SUSP
30.0000 g | Freq: Once | ORAL | Status: AC
  Administered 2019-05-25: 30 g via ORAL
  Filled 2019-05-25: qty 120

## 2019-05-25 NOTE — Progress Notes (Signed)
PROGRESS NOTE                                                                                                                                                                                                             Patient Demographics:    Virginia Chan, is a 83 y.o. female, DOB - Dec 27, 1935, PIR:[id number]  Outpatient Primary MD for the patient is Street, [name], MD    LOS - 11  Admit date - 05/14/2019    CC - SOB     Brief Narrative  Patient is a 83 y.o. highly functional female (still works) with PMHx of hypertension-presented with approximately 1 week history of fatigue and malaise-and 2-3-day history of worsening shortness of breath.  She is evaluated at [facility] health-she was positive for COVID-19-she was found to have severe hypoxemia requiring NRB in the setting of COVID-19 pneumonia.   Subjective:   Patient in bed, appears comfortable, denies any headache, no fever, no chest pain or pressure, mild shortness of breath , no abdominal pain. No focal weakness.    Assessment  & Plan :     1. Acute Hypoxic Resp. Failure due to Acute Covid 19 Viral Pneumonitis during the ongoing 2020 Covid 19 Pandemic - she had severe disease , she has been started on IV steroids and remdesivir, also received convalescent plasma and Actemra.  Also needed IV Lasix, finally she is doing better on 05/23/2019 and now on 2-3 L of nasal cannula oxygen and feeling better, will continue to monitor another day if stable discharge on 05/24/2019.  Encouraged her to sit up in chair in the daytime use I-S and flutter valve for pulmonary toiletry and then prone in bed when at night.  SpO2: 90 % O2 Flow Rate (L/min): 5 L/min  Recent Labs  Lab 05/19/19 0100 05/20/19 0301 05/21/19 0520  CRP 2.6* 1.2* 1.0*  DDIMER 1.52* 1.66* 1.64*  FERRITIN 960*  --   --   BNP 46.5 50.6  --   PROCALCITON 0.13  --   --     Hepatic Function Latest Ref Rng  & Units 05/25/2019 05/24/2019 05/23/2019  Total Protein 6.5 - 8.1 g/dL 5.7(L) 6.1(L) 6.2(L)  Albumin 3.5 - 5.0 g/dL 3.1(L) 3.2(L) 3.3(L)  AST 15 - 41 U/L 39 37 35  ALT 0 - 44 U/L 33 34 30  Alk Phosphatase 38 - 126 U/L 96 100 99  Total Bilirubin 0.3 - 1.2 mg/dL 0.9 0.8 1.2      2.  Leukocytosis.  Is due to steroid use, stable chest x-ray, UA and procalcitonin.  Afebrile.  3.  Minimally elevated LFTs.  Likely due to COVID-19.  Resolved.  4.  Essential hypertension.  Stable on Norvasc, will add low-dose oral hydralazine for better control.  5.  Few crackles on exam.  IV Lasix repeat on 05/22/2019 .  She is much improved after diuresis.  Will recommend PCP to get an outpatient echocardiogram.  6.  Hyperkalemia.  Repeat Lasix and Kayexalate.  7.  AKI versus CKD 3 with Hyponatremia .  No baseline renal function in chart, became hyponatremic and creatinine worse after IV fluids on 05/24/2019, will give Lasix and monitor.  Stop fluids.  8.  Urinary retention.  On Foley and Flomax, failed Foley removal on 05/24/2019.    Condition - Extremely Guarded  Family Communication  :    Son  12/7, husband 12/8, 12/9.    Son [name] on 05/22/2019 explained tenuous situation.  He understands that she is very sick.  He would want short-term life support if needed. 12/11, 12/12  Code Status :  Full  Diet :   Diet Order            Diet heart healthy/carb modified Room service appropriate? Yes; Fluid consistency: Thin  Diet effective now               Disposition Plan  :  Home in am if renal function better  Consults  :  None  Procedures  :     PUD Prophylaxis : PPI  DVT Prophylaxis  :    Heparin    Lab Results  Component Value Date   PLT 230 05/25/2019    Inpatient Medications  Scheduled Meds: . cephALEXin  500 mg Oral Q8H  . chlorpheniramine-HYDROcodone  5 mL Oral Q12H  . diltiazem  60 mg Oral Q12H  . docusate sodium  100 mg Oral BID  . gabapentin  300 mg Oral BID  .  heparin injection (subcutaneous)  7,500 Units Subcutaneous Q8H  . insulin aspart  0-9 Units Subcutaneous TID WC  . mouth rinse  15 mL Mouth Rinse BID  . pantoprazole  40 mg Oral Daily  . polyethylene glycol  17 g Oral BID  . tamsulosin  0.4 mg Oral Daily  . vitamin C  500 mg Oral Daily  . vitamin E  1,000 Units Oral Daily  . zinc sulfate  220 mg Oral Daily   Continuous Infusions:  PRN Meds:.acetaminophen, diltiazem, guaiFENesin-dextromethorphan, hydrALAZINE, [DISCONTINUED] ondansetron **OR** ondansetron (ZOFRAN) IV, senna-docusate, sodium chloride, traMADol  Antibiotics  :    Anti-infectives (From admission, onward)   Start     Dose/Rate Route Frequency Ordered Stop   05/24/19 1500  cephALEXin (KEFLEX) capsule 500 mg     500 mg Oral Every 8 hours 05/24/19 1408     05/17/19 1000  remdesivir 100 mg in sodium chloride 0.9 % 100 mL IVPB     100 mg 200 mL/hr over 30 Minutes Intravenous Daily 05/16/19 2124 05/18/19 0945   05/15/19 1000  remdesivir 100 mg in sodium chloride 0.9 % 250 mL IVPB  Status:  Discontinued     100 mg 500 mL/hr over 30 Minutes Intravenous Every 24 hours 05/14/19 1708 05/16/19 2123       Time Spent in minutes  30  Lala Lund M.D on 05/25/2019 at 11:16 AM  To page go to [email or web address] - password [facility]  [facility] -  Office  [phone]   See all Orders from today for further details    Objective:   Vitals:   05/25/19 0400 05/25/19 0500 05/25/19 0730 05/25/19 0734  BP: (!) 118/59  (!) 135/50   Pulse: 73 76 84   Resp: _0 Temp:  97.6 F (36.4 C) (!) 97.5 F (36.4 C)   TempSrc:  Oral Oral   SpO2: 98% 95% 90% 90%  Weight:      Height:        Wt Readings from Last 3 Encounters:  05/14/19 54.7 kg     Intake/Output Summary (Last 24 hours) at 05/25/2019 1116 Last data filed at 05/25/2019 0820 Gross per 24 hour  Intake 730 ml  Output 400 ml  Net 330 ml   Physical Exam  Awake Alert,   No new F.N deficits, Normal  affect [place].AT,PERRAL Supple Neck,No JVD, No cervical lymphadenopathy appriciated.  Symmetrical Chest wall movement, Good air movement bilaterally, +ve rales RRR,No Gallops, Rubs or new Murmurs, No Parasternal Heave +ve B.Sounds, Abd Soft, No tenderness, No organomegaly appriciated, No rebound - guarding or rigidity. No Cyanosis, Clubbing or edema, No new Rash or bruise    Data Review:    CBC Recent Labs  Lab 05/20/19 0301 05/21/19 0520 05/23/19 0323 05/24/19 0311 05/25/19 0045  WBC 17.2* 16.5* 14.1* 17.0* 14.1*  HGB 12.7 13.3 12.9 11.9* 10.8*  HCT 37.4 39.5 38.2 34.8* 32.8*  PLT 364 322 281 259 230  MCV 94.7 94.0 94.8 93.3 95.3  MCH 32.2 31.7 32.0 31.9 31.4  MCHC 34.0 33.7 33.8 34.2 32.9  RDW 13.2 13.2 12.9 12.6 12.7  LYMPHSABS 0.9 0.7 1.1 1.2 1.2  MONOABS 0.8 0.7 0.8 1.0 0.8  EOSABS 0.2 0.4 0.5 0.3 0.3  BASOSABS 0.1 0.1 0.1 0.1 0.1    Chemistries  Recent Labs  Lab 05/19/19 0100 05/20/19 0301 05/21/19 0520 05/22/19 0245 05/23/19 0323 05/24/19 0311 05/25/19 0045  NA 138 131* 132* 132* 131* 129* 127*  K 3.9 3.6 4.3 4.8 4.5 4.9 5.7*  CL 99 93* 92* 92* 91* 90* 89*  CO2 _1 GLUCOSE 164* 167* 146* 181* 129* 128* 147*  BUN 73* 75* 87* 77* 80* 91* 71*  CREATININE 1.36* 1.31* 1.37* 1.53* 1.60* 1.58* 1.50*  CALCIUM 9.0 8.9 9.0 9.0 8.8* 9.0 8.8*  MG 2.2 2.4  --   --  2.4 2.3 2.5*  AST 30 27  --  39 35 37 39  ALT 23 21  --  31 30 34 33  ALKPHOS 115 106  --  106 99 100 96  BILITOT 0.5 0.8  --  0.7 1.2 0.8 0.9   ------------------------------------------------------------------------------------------------------------------ No results for input(s): CHOL, HDL, LDLCALC, TRIG, CHOLHDL, LDLDIRECT in the last 72 hours.  Lab Results  Component Value Date   HGBA1C 6.7 (H) 05/14/2019   ------------------------------------------------------------------------------------------------------------------ No results for input(s): TSH, T4TOTAL, T3FREE, THYROIDAB  in the last 72 hours.  Invalid input(s): FREET3  Cardiac Enzymes No results for input(s): CKMB, TROPONINI, MYOGLOBIN in the last 168 hours.  Invalid input(s): CK ------------------------------------------------------------------------------------------------------------------    Component Value Date/Time   BNP 50.6 05/20/2019 0301    Micro Results Recent Results (from the past 240 hour(s))  Culture, Urine     Status: None   Collection Time: 05/18/19  7:30 AM   Specimen:  Urine, Random  Result Value Ref Range Status   Specimen Description   Final    URINE, RANDOM Performed at [facility] [address], [place], [place] [address]    Special Requests   Final    NONE Performed at [facility], [place] [address], [place], [place] [address]    Culture   Final    NO GROWTH Performed at [facility], [place] [address] [place], [place] [address]    Report Status 05/19/2019 FINAL  Final    Radiology Reports DG Chest Port 1 View  Result Date: 05/23/2019 CLINICAL DATA:  Shortness of breath.  COVID-19. EXAM: PORTABLE CHEST 1 VIEW COMPARISON:  05/17/2019 FINDINGS: Midline trachea. Normal heart size. Atherosclerosis in the transverse aorta. No pleural effusion or pneumothorax. Biapical pleural thickening. Given differences in technique, similar patchy interstitial opacities bilaterally. Relative sparing of the right lung base. Mild right hemidiaphragm elevation. Surgical clips in the right upper quadrant. IMPRESSION: Similar bilateral interstitial opacities, most consistent with COVID-19 pneumonia. Aortic Atherosclerosis (ICD10-I70.0). Electronically Signed   By: Abigail Miyamoto M.D.   On: 05/23/2019 08:41   CXR am  Result Date: 05/17/2019 CLINICAL DATA:  Short of breath EXAM: PORTABLE CHEST 1 VIEW COMPARISON:  05/14/2019 FINDINGS: Decreased density of perihilar airspace disease compared to prior. Mild central airspace disease as remain. No pleural  fluid. No pneumothorax. IMPRESSION: 1. Improvement in perihilar airspace disease. 2. Mild central airspace disease remains. Electronically Signed   By: Suzy Bouchard M.D.   On: 05/17/2019 11:32

## 2019-05-26 LAB — BRAIN NATRIURETIC PEPTIDE: B Natriuretic Peptide: 46.8 pg/mL (ref 0.0–100.0)

## 2019-05-26 LAB — CBC WITH DIFFERENTIAL/PLATELET
Abs Immature Granulocytes: 0.81 10*3/uL — ABNORMAL HIGH (ref 0.00–0.07)
Basophils Absolute: 0.1 10*3/uL (ref 0.0–0.1)
Basophils Relative: 1 %
Eosinophils Absolute: 0.3 10*3/uL (ref 0.0–0.5)
Eosinophils Relative: 3 %
HCT: 32.1 % — ABNORMAL LOW (ref 36.0–46.0)
Hemoglobin: 11 g/dL — ABNORMAL LOW (ref 12.0–15.0)
Immature Granulocytes: 7 %
Lymphocytes Relative: 13 %
Lymphs Abs: 1.6 10*3/uL (ref 0.7–4.0)
MCH: 32.6 pg (ref 26.0–34.0)
MCHC: 34.3 g/dL (ref 30.0–36.0)
MCV: 95.3 fL (ref 80.0–100.0)
Monocytes Absolute: 0.4 10*3/uL (ref 0.1–1.0)
Monocytes Relative: 3 %
Neutro Abs: 9.2 10*3/uL — ABNORMAL HIGH (ref 1.7–7.7)
Neutrophils Relative %: 73 %
Platelets: 215 10*3/uL (ref 150–400)
RBC: 3.37 MIL/uL — ABNORMAL LOW (ref 3.87–5.11)
RDW: 12.7 % (ref 11.5–15.5)
WBC: 12.5 10*3/uL — ABNORMAL HIGH (ref 4.0–10.5)
nRBC: 0.2 % (ref 0.0–0.2)

## 2019-05-26 LAB — BASIC METABOLIC PANEL
Anion gap: 13 (ref 5–15)
BUN: 55 mg/dL — ABNORMAL HIGH (ref 8–23)
CO2: 30 mmol/L (ref 22–32)
Calcium: 8.7 mg/dL — ABNORMAL LOW (ref 8.9–10.3)
Chloride: 88 mmol/L — ABNORMAL LOW (ref 98–111)
Creatinine, Ser: 1.39 mg/dL — ABNORMAL HIGH (ref 0.44–1.00)
GFR calc Af Amer: 41 mL/min — ABNORMAL LOW (ref >60)
GFR calc non Af Amer: 35 mL/min — ABNORMAL LOW (ref >60)
Glucose, Bld: 90 mg/dL (ref 70–99)
Potassium: 4.4 mmol/L (ref 3.5–5.1)
Sodium: 131 mmol/L — ABNORMAL LOW (ref 135–145)

## 2019-05-26 LAB — GLUCOSE, CAPILLARY
Glucose-Capillary: 141 mg/dL — ABNORMAL HIGH (ref 70–99)
Glucose-Capillary: 113 mg/dL — ABNORMAL HIGH (ref 70–99)
Glucose-Capillary: 175 mg/dL — ABNORMAL HIGH (ref 70–99)

## 2019-05-26 LAB — MAGNESIUM: Magnesium: 2 mg/dL (ref 1.7–2.4)

## 2019-05-26 MED ORDER — ALBUTEROL SULFATE HFA 108 (90 BASE) MCG/ACT IN AERS: 2.0000 | INHALATION_SPRAY | Freq: Four times a day (QID) | RESPIRATORY_TRACT | 0 refills | Status: AC | PRN

## 2019-05-26 MED ORDER — FINASTERIDE 5 MG PO TABS: 5.0000 mg | ORAL_TABLET | Freq: Every day | ORAL | 0 refills | Status: AC

## 2019-05-26 NOTE — Discharge Summary (Signed)
Virginia Chan [id number] DOB: 11-Apr-1936 DOA: 05/14/2019  PCP: Emmaline Kluver, MD  Admit date: 05/14/2019  Discharge date: 05/26/2019  Admitted From: Home Disposition:  Home   Recommendations for Outpatient Follow-up:   Follow up with PCP in 1-2 weeks  PCP Please obtain BMP/CBC, 2 view CXR in 1week,  (see Discharge instructions)   PCP Please follow up on the following pending results:    Home Health:  PT, RN   Equipment/Devices: o2 2lit  Consultations: None  Discharge Condition: Stable    CODE STATUS: Full    Diet Recommendation: Heart Healthy Low Carb  CC - SOB   Brief history of present illness from the day of admission and additional interim summary    Patient is a83 y.o.highly functional female (still works) with PMHx of hypertension-presented with approximately 1 week history of fatigue and malaise-and 2-3-day history of worsening shortness of breath. She is evaluated at [facility] health-she was positive for COVID-19-she was found to have severe hypoxemia requiring NRB in the setting of COVID-19 pneumonia.                                                                 Hospital Course   1. Acute Hypoxic Resp. Failure due to Acute Covid 19 Viral Pneumonitis during the ongoing 2020 Covid 19 Pandemic - she had severe disease , she was started on IV steroids and remdesivir, also received convalescent plasma and Actemra.  She had severe parenchymal disease and took a long time to get better, still on 2 to 3 L nasal cannula oxygen on which she will go home.  She has finished all her treatment.  Will follow with PCP in a week.  May require oxygen for the next several weeks.  She has been symptom-free for several days and eager to go home.  She is insisting today that she should be discharged ASAP as she  feels great.  2.  Leukocytosis.  Is due to steroid use, stable chest x-ray, UA and procalcitonin.  Afebrile.  PCP to repeat CBC and BMP in 7 to 10 days.  3.  Minimally elevated LFTs.  Likely due to COVID-19.  Resolved.  4.  Essential hypertension.  Stable continue home regimen.  5.  Few crackles on exam.    Solved after diuresis with IV Lasix, continue home dose HCTZ.  Will recommend PCP to get an outpatient echocardiogram.  6.  Hyperkalemia.    Resolved after Kayexalate and Lasix  7.  AKI versus CKD 3 with Hyponatremia .  No baseline renal function in chart, resolved after supportive care creatinine has plateaued around 1.349 which could be her baseline, PCP to repeat BMP in 7 to 10 days.  8.  Urinary retention.  On Foley and Flomax, failed Foley removal on 05/24/2019.  Will  be discharged on alpha-blocker with outpatient urology follow-up with Foley catheter.  Home PT RN also ordered.   Discharge diagnosis     Principal Problem:   Acute respiratory failure with hypoxia (HCC) Active Problems:   Pneumonia due to COVID-19 virus   Benign essential HTN    Discharge instructions    Discharge Instructions    Diet - low sodium heart healthy   Complete by: As directed    Discharge instructions   Complete by: As directed    Follow with Primary MD Street, [name], MD in 7 days   Get CBC, CMP, 2 view Chest X ray -  checked next visit within 1 week by Primary MD   Activity: As tolerated with Full fall precautions use walker/cane & assistance as needed  Disposition Home   Diet: Heart Healthy Low Carb  Special Instructions: If you have smoked or chewed Tobacco  in the last 2 yrs please stop smoking, stop any regular Alcohol  and or any Recreational drug use.  On your next visit with your primary care physician please Get Medicines reviewed and adjusted.  Please request your Prim.MD to go over all Hospital Tests and Procedure/Radiological results at the follow up,  please get all Hospital records sent to your Prim MD by signing hospital release before you go home.  If you experience worsening of your admission symptoms, develop shortness of breath, life threatening emergency, suicidal or homicidal thoughts you must seek medical attention immediately by calling 911 or calling your MD immediately  if symptoms less severe.  You Must read complete instructions/literature along with all the possible adverse reactions/side effects for all the Medicines you take and that have been prescribed to you. Take any new Medicines after you have completely understood and accpet all the possible adverse reactions/side effects.   Do Not Remove Foley   Complete by: As directed    Increase activity slowly   Complete by: As directed    MyChart COVID-19 home monitoring program   Complete by: May 26, 2019    Is the patient willing to use the [place] for home monitoring?: Yes   Temperature monitoring   Complete by: May 26, 2019    After how many days would you like to receive a notification of this patient's flowsheet entries?: 1      Discharge Medications   Allergies as of 05/26/2019   Not on File     Medication List    STOP taking these medications   levofloxacin 750 MG tablet Commonly known as: LEVAQUIN     TAKE these medications   albuterol 108 (90 Base) MCG/ACT inhaler Commonly known as: VENTOLIN HFA Inhale 2 puffs into the lungs every 6 (six) hours as needed for wheezing or shortness of breath.   celecoxib 200 MG capsule Commonly known as: CELEBREX Take 200 mg by mouth 2 (two) times daily as needed.   finasteride 5 MG tablet Commonly known as: PROSCAR Take 1 tablet (5 mg total) by mouth daily.   gabapentin 600 MG tablet Commonly known as: NEURONTIN Take 600 mg by mouth at bedtime as needed.   latanoprost 0.005 % ophthalmic solution Commonly known as: XALATAN Place 1 drop into both eyes at bedtime.   losartan-hydrochlorothiazide  100-12.5 MG tablet Commonly known as: HYZAAR Take 1 tablet by mouth every morning.   omeprazole 20 MG capsule Commonly known as: PRILOSEC Take 20 mg by mouth daily.   vitamin C 1000 MG tablet Take 1,000 mg by  mouth daily.   Vitamin D3 1.25 MG (50000 UT) Tabs Take 1.25 mg by mouth once a week.   vitamin E 100 UNIT capsule Take 1,000 Units by mouth daily.   Voltaren 1 % Gel Generic drug: diclofenac Sodium Apply 4 g topically 4 (four) times daily as needed for pain.            Durable Medical Equipment  (From admission, onward)         Start     Ordered   05/23/19 1113  For home use only DME oxygen  Once    Question Answer Comment  Length of Need 6 Months   Mode or (Route) Nasal cannula   Liters per Minute 3   Frequency Continuous (stationary and portable oxygen unit needed)   Oxygen conserving device Yes   Oxygen delivery system Gas      05/23/19 1113          Follow-up Information    [place] Follow up.   Why: Home 02 arranged- AC to bring portable tank to room prior to discharge for transport home- home equipment to be delivered to home prior to discharge Contact information: [address] [place] [address] [place], [place] Follow up.   Specialty: [place] Why: HHRN/PT arranged- they will call next week to set up home visits Contact information: [place] [address] [phone]        Street, [name], MD. Schedule an appointment as soon as possible for a visit in 1 week(s).   Specialty: Family Medicine Contact information: [place] [address] [phone]        Alexis Frock, MD. Schedule an appointment as soon as possible for a visit in 1 week(s).   Specialty: Urology Why: Urinary retention Contact information: [place] [address] [phone]           Major procedures and  Radiology Reports - PLEASE review detailed and final reports thoroughly  -         DG Chest [facility] 1 View  Result Date: 05/23/2019 CLINICAL DATA:  Shortness of breath.  COVID-19. EXAM: PORTABLE CHEST 1 VIEW COMPARISON:  05/17/2019 FINDINGS: Midline trachea. Normal heart size. Atherosclerosis in the transverse aorta. No pleural effusion or pneumothorax. Biapical pleural thickening. Given differences in technique, similar patchy interstitial opacities bilaterally. Relative sparing of the right lung base. Mild right hemidiaphragm elevation. Surgical clips in the right upper quadrant. IMPRESSION: Similar bilateral interstitial opacities, most consistent with COVID-19 pneumonia. Aortic Atherosclerosis (ICD10-I70.0). Electronically Signed   By: Abigail Miyamoto M.D.   On: 05/23/2019 08:41   CXR am  Result Date: 05/17/2019 CLINICAL DATA:  Short of breath EXAM: PORTABLE CHEST 1 VIEW COMPARISON:  05/14/2019 FINDINGS: Decreased density of perihilar airspace disease compared to prior. Mild central airspace disease as remain. No pleural fluid. No pneumothorax. IMPRESSION: 1. Improvement in perihilar airspace disease. 2. Mild central airspace disease remains. Electronically Signed   By: Suzy Bouchard M.D.   On: 05/17/2019 11:32    Micro Results     Recent Results (from the past 240 hour(s))  Culture, Urine     Status: None   Collection Time: 05/18/19  7:30 AM   Specimen: Urine, Random  Result Value Ref Range Status   Specimen Description   Final    URINE, RANDOM Performed at [facility], [address] [place], [place] [address]    Special Requests   Final    NONE Performed at [facility], [place] [address], [place], [place] [address]    Culture   Final    NO GROWTH Performed at [facility], [place] [address] [place], [place] [address]    Report Status 05/19/2019 FINAL  Final    Today   Subjective    Mirai Drain today has no headache,no chest  abdominal pain,no new weakness tingling or numbness, feels much better wants to go home today.   Objective   Blood pressure 105/85, pulse 98, temperature 97.7 F (36.5 C), temperature source Oral, resp. rate (!) 23, height 5\' 4"  (1.626 m), weight 54.7 kg, SpO2 90%.   Intake/Output Summary (Last 24 hours) at 05/26/2019 1156 Last data filed at 05/26/2019 0800 Gross per 24 hour  Intake 970 ml  Output 1820 ml  Net -850 ml    Exam  Awake Alert, No new F.N deficits, Normal affect [place].AT,PERRAL Supple Neck,No JVD, No cervical lymphadenopathy appriciated.  Symmetrical Chest wall movement, Good air movement bilaterally, CTAB, foley in place RRR,No Gallops,Rubs or new Murmurs, No Parasternal Heave +ve B.Sounds, Abd Soft, Non tender, No organomegaly appriciated, No rebound -guarding or rigidity. No Cyanosis, Clubbing or edema, No new Rash or bruise   Data Review   CBC w Diff:  Lab Results  Component Value Date   WBC 12.5 (H) 05/26/2019   HGB 11.0 (L) 05/26/2019   HCT 32.1 (L) 05/26/2019   PLT 215 05/26/2019   LYMPHOPCT 13 05/26/2019   BANDSPCT 5 05/15/2019   MONOPCT 3 05/26/2019   EOSPCT 3 05/26/2019   BASOPCT 1 05/26/2019    CMP:  Lab Results  Component Value Date   NA 131 (L) 05/26/2019   K 4.4 05/26/2019   CL 88 (L) 05/26/2019   CO2 30 05/26/2019   BUN 55 (H) 05/26/2019   CREATININE 1.39 (H) 05/26/2019   PROT 5.7 (L) 05/25/2019   ALBUMIN 3.1 (L) 05/25/2019   BILITOT 0.9 05/25/2019   ALKPHOS 96 05/25/2019   AST 39 05/25/2019   ALT 33 05/25/2019  .   Total Time in preparing paper work, data evaluation and todays exam - 65 minutes  Lala Lund M.D on 05/26/2019 at 11:56 AM  [facility]   Office  [phone]

## 2019-05-26 NOTE — TOC Progression Note (Signed)
Transition of Care [facility]) - Progression Note    Patient Details  Name: GWENNETH JARNAGIN MRN: [id number] Date of Birth: 02/18/1936  Transition of [facility]) CM/SW Contact  Joaquin Courts, RN Phone Number: 05/26/2019, 12:40 PM  Clinical Narrative:   CM confirmed patient has [place] services set up and [employer] has delivered the concentrator to the home. AC notified and a portable tank for transport home will be brought to the bedside. No further needs identified.     Expected Discharge Plan: [place] Barriers to Discharge: Continued Medical Work up  Expected Discharge Plan and Services Expected Discharge Plan: [place]   Discharge Planning Services: CM Consult Post Acute Care Choice: Home Health, Durable Medical Equipment Living arrangements for the past 2 months: Single Family Home Expected Discharge Date: 05/26/19               DME Arranged: Oxygen DME Agency: [place] Date DME Agency Contacted: 05/23/19 Time DME Agency Contacted: 108 Representative spoke with at DME Agency: Learta Codding HH Arranged: RN, PT [facility] Agency: [facility] Date [place]: 05/23/19 Time [place]: 30 Representative spoke with at [place]: [place] ([place]) Interventions    Readmission Risk Interventions No flowsheet data found.

## 2019-05-26 NOTE — Progress Notes (Signed)
Occupational Therapy Treatment Patient Details Name: Virginia Chan MRN: [id number] DOB: 1935/12/16 Today's Date: 05/26/2019    History of present illness Pt is an 83 y.o. female admitted 05/14/19 with worsening malaise, fever and myalgia. Found to have hyoxpia needing NRB; (+) COVID-19 with multifocal PNA. PMH includes HTN.   OT comments  Pt continues to present with decreased strength, balance ,and activity tolerance. Despite fatigue, pt motivated too participate in therapy. Pt requiring Min A throughout toileting and functional mobility. Pt attempting to perform oral care at sink but required to complete task seated at sink due to fatigue and SpO2 dropping into 60s. Throughout session, pt SpO2 fluctuating between 80s-60s on 6-8L O2.  Cued for purse lip breathing and noting some improvement. Continue to recommend post-acute therapy. However, pt wanting to dc home and will require HHOT and 24/7 support.    Follow Up Recommendations  CIR;Supervision/Assistance - 24 hour(Pt wanting to dc home; will need HHOT)    Equipment Recommendations  Tub/shower seat;3 in 1 bedside commode    Recommendations for Other Services      Precautions / Restrictions Precautions Precautions: Fall Precaution Comments: desats easily, watch HR Restrictions Weight Bearing Restrictions: No       Mobility Bed Mobility               General bed mobility comments: Received sitting in recliner  Transfers Overall transfer level: Needs assistance Equipment used: Rolling walker (2 wheeled) Transfers: Sit to/from Stand Sit to Stand: Min assist;Min guard         General transfer comment: Min A to steady once in standing. Demonstrating poor balance    Balance Overall balance assessment: Needs assistance Sitting-balance support: Feet supported Sitting balance-Leahy Scale: Fair     Standing balance support: During functional activity;Single extremity supported;Bilateral upper extremity supported Standing  balance-Leahy Scale: Poor Standing balance comment: Reliant on UE support                           ADL either performed or assessed with clinical judgement   ADL Overall ADL's : Needs assistance/impaired     Grooming: Sitting;Brushing hair;Oral care;Min guard Grooming Details (indicate cue type and reason): Pt performing mobility to sink in bathroom and required to sit due to fatigue and low SpO2 in the 60s. Pt performing oral care while seated with cues for safety. SpO2 flucuating between 66%-84% on 6-8L O2 showing poor consistancy despite good pleth line. (O2 monitor on earlobe)                 Toilet Transfer: Minimal assistance;Ambulation;BSC Toilet Transfer Details (indicate cue type and reason): Min A for balance and safety in standing Toileting- Clothing Manipulation and Hygiene: Minimal assistance;Sit to/from stand;Sitting/lateral lean Toileting - Clothing Manipulation Details (indicate cue type and reason): Pt able to perform peri care while seated with Min Gaurd A. Pt requiring Min A for satnding balance to finish peri care.     Functional mobility during ADLs: Minimal assistance;Rolling walker General ADL Comments: Pt continues to present with poor balance, strength, and activity tolerance     Vision       Perception     Praxis      Cognition Arousal/Alertness: Awake/alert Behavior During Therapy: WFL for tasks assessed/performed;Anxious Overall Cognitive Status: No family/caregiver present to determine baseline cognitive functioning Area of Impairment: Attention;Safety/judgement;Awareness;Problem solving  Current Attention Level: Selective     Safety/Judgement: Decreased awareness of deficits Awareness: Emergent Problem Solving: Requires verbal cues General Comments: Pt following commands and motivated to partcipate in therapy. Pt presenting with decreased awareness with use of RW and requiring cues for avoiding  objects. Pt also verablizing understanding about SpO2 levels but then showing poor carry over to how this impact function at home.         Exercises Exercises: Other exercises Other Exercises Other Exercises: use of flutter valve x10 Other Exercises: reinforced use of IS   Shoulder Instructions       General Comments SpO2 flucuating between 83-66% on 6L O2 during ADLs. O2 monitor on her earlobe and pleth line good. Placed on 8L O2 and she continues to flucuate betwene 80s and 60s. Used for purse lip breathing. Noted that SpO2 elevated with use of flutter valve.     Pertinent Vitals/ Pain       Pain Assessment: Faces Faces Pain Scale: Hurts a little bit Pain Location: Chest with breathing Pain Descriptors / Indicators: Discomfort Pain Intervention(s): Monitored during session;Repositioned  Home Living                                          Prior Functioning/Environment              Frequency  Min 2X/week        Progress Toward Goals  OT Goals(current goals can now be found in the care plan section)  Progress towards OT goals: Progressing toward goals  Acute Rehab OT Goals Patient Stated Goal: go home OT Goal Formulation: With patient Time For Goal Achievement: 05/29/19 Potential to Achieve Goals: Good ADL Goals Pt Will Perform Grooming: sitting;with modified independence Pt Will Perform Lower Body Bathing: with supervision;sitting/lateral leans;sit to/from stand Pt Will Perform Lower Body Dressing: with supervision;sitting/lateral leans;sit to/from stand Pt Will Transfer to Toilet: with supervision;stand pivot transfer;ambulating Additional ADL Goal #1: Pt will engage in seated ADL EOB at supervision level with VSS throughout.  Plan Discharge plan remains appropriate    Co-evaluation                 AM-PAC OT "6 Clicks" Daily Activity     Outcome Measure   Help from another person eating meals?: A Little Help from another person  taking care of personal grooming?: A Little Help from another person toileting, which includes using toliet, bedpan, or urinal?: A Lot Help from another person bathing (including washing, rinsing, drying)?: A Lot Help from another person to put on and taking off regular upper body clothing?: A Little Help from another person to put on and taking off regular lower body clothing?: A Lot 6 Click Score: 15    End of Session Equipment Utilized During Treatment: Oxygen;Rolling walker  OT Visit Diagnosis: Muscle weakness (generalized) (M62.81);Other abnormalities of gait and mobility (R26.89);Unsteadiness on feet (R26.81)   Activity Tolerance Patient tolerated treatment well   Patient Left with call bell/phone within reach;in chair;with chair alarm set   Nurse Communication Mobility status        Time: [phone] OT Time Calculation (min): 39 min  Charges: OT General Charges $OT Visit: 1 Visit OT Treatments $Self Care/Home Management : 38-52 mins  Her[place], OTR/L Acute Rehab Pager: [phone] Office: [place] 05/26/2019, 11:10 AM

## 2019-05-26 NOTE — Discharge Instructions (Signed)
Follow with Primary MD Street, [name], MD in 7 days   Get CBC, CMP, 2 view Chest X ray -  checked next visit within 1 week by Primary MD   Activity: As tolerated with Full fall precautions use walker/cane & assistance as needed  Disposition Home   Diet: Heart Healthy Low Carb  Special Instructions: If you have smoked or chewed Tobacco  in the last 2 yrs please stop smoking, stop any regular Alcohol  and or any Recreational drug use.  On your next visit with your primary care physician please Get Medicines reviewed and adjusted.  Please request your Prim.MD to go over all Hospital Tests and Procedure/Radiological results at the follow up, please get all Hospital records sent to your Prim MD by signing hospital release before you go home.  If you experience worsening of your admission symptoms, develop shortness of breath, life threatening emergency, suicidal or homicidal thoughts you must seek medical attention immediately by calling 911 or calling your MD immediately  if symptoms less severe.  You Must read complete instructions/literature along with all the possible adverse reactions/side effects for all the Medicines you take and that have been prescribed to you. Take any new Medicines after you have completely understood and accpet all the possible adverse reactions/side effects.           Person Under Monitoring Name: Virginia Chan  Location: [place] [address]   Infection Prevention Recommendations for Individuals Confirmed to have, or Being Evaluated for, 2019 Novel Coronavirus (COVID-19) Infection Who Receive Care at Home  Individuals who are confirmed to have, or are being evaluated for, COVID-19 should follow the prevention steps below until a healthcare provider or local or state health department says they can return to normal activities.  Stay home except to get medical care You should restrict activities outside your home, except for  getting medical care. Do not go to work, school, or public areas, and do not use public transportation or taxis.  Call ahead before visiting your doctor Before your medical appointment, call the healthcare provider and tell them that you have, or are being evaluated for, COVID-19 infection. This will help the healthcare providers office take steps to keep other people from getting infected. Ask your healthcare provider to call the local or state health department.  Monitor your symptoms Seek prompt medical attention if your illness is worsening (e.g., difficulty breathing). Before going to your medical appointment, call the healthcare provider and tell them that you have, or are being evaluated for, COVID-19 infection. Ask your healthcare provider to call the local or state health department.  Wear a facemask You should wear a facemask that covers your nose and mouth when you are in the same room with other people and when you visit a healthcare provider. People who live with or visit you should also wear a facemask while they are in the same room with you.  Separate yourself from other people in your home As much as possible, you should stay in a different room from other people in your home. Also, you should use a separate bathroom, if available.  Avoid sharing household items You should not share dishes, drinking glasses, cups, eating utensils, towels, bedding, or other items with other people in your home. After using these items, you should wash them thoroughly with soap and water.  Cover your coughs and sneezes Cover your mouth and nose with a tissue when you cough or sneeze, or  you can cough or sneeze into your sleeve. Throw used tissues in a lined trash can, and immediately wash your hands with soap and water for at least 20 seconds or use an alcohol-based hand rub.  Wash your [facility] your hands often and thoroughly with soap and water for at least 20 seconds. You can use  an alcohol-based hand sanitizer if soap and water are not available and if your hands are not visibly dirty. Avoid touching your eyes, nose, and mouth with unwashed hands.   Prevention Steps for Caregivers and Household Members of Individuals Confirmed to have, or Being Evaluated for, COVID-19 Infection Being Cared for in the Home  If you live with, or provide care at home for, a person confirmed to have, or being evaluated for, COVID-19 infection please follow these guidelines to prevent infection:  Follow healthcare providers instructions Make sure that you understand and can help the patient follow any healthcare provider instructions for all care.  Provide for the patients basic needs You should help the patient with basic needs in the home and provide support for getting groceries, prescriptions, and other personal needs.  Monitor the patients symptoms If they are getting sicker, call his or her medical provider and tell them that the patient has, or is being evaluated for, COVID-19 infection. This will help the healthcare providers office take steps to keep other people from getting infected. Ask the healthcare provider to call the local or state health department.  Limit the number of people who have contact with the patient  If possible, have only one caregiver for the patient.  Other household members should stay in another home or place of residence. If this is not possible, they should stay  in another room, or be separated from the patient as much as possible. Use a separate bathroom, if available.  Restrict visitors who do not have an essential need to be in the home.  Keep older adults, very young children, and other sick people away from the patient Keep older adults, very young children, and those who have compromised immune systems or chronic health conditions away from the patient. This includes people with chronic heart, lung, or kidney conditions, diabetes,  and cancer.  Ensure good ventilation Make sure that shared spaces in the home have good air flow, such as from an air conditioner or an opened window, weather permitting.  Wash your hands often  Wash your hands often and thoroughly with soap and water for at least 20 seconds. You can use an alcohol based hand sanitizer if soap and water are not available and if your hands are not visibly dirty.  Avoid touching your eyes, nose, and mouth with unwashed hands.  Use disposable paper towels to dry your hands. If not available, use dedicated cloth towels and replace them when they become wet.  Wear a facemask and gloves  Wear a disposable facemask at all times in the room and gloves when you touch or have contact with the patients blood, body fluids, and/or secretions or excretions, such as sweat, saliva, sputum, nasal mucus, vomit, urine, or feces.  Ensure the mask fits over your nose and mouth tightly, and do not touch it during use.  Throw out disposable facemasks and gloves after using them. Do not reuse.  Wash your hands immediately after removing your facemask and gloves.  If your personal clothing becomes contaminated, carefully remove clothing and launder. Wash your hands after handling contaminated clothing.  Place all used disposable facemasks,  gloves, and other waste in a lined container before disposing them with other household waste.  Remove gloves and wash your hands immediately after handling these items.  Do not share dishes, glasses, or other household items with the patient  Avoid sharing household items. You should not share dishes, drinking glasses, cups, eating utensils, towels, bedding, or other items with a patient who is confirmed to have, or being evaluated for, COVID-19 infection.  After the person uses these items, you should wash them thoroughly with soap and water.  Wash laundry thoroughly  Immediately remove and wash clothes or bedding that have blood,  body fluids, and/or secretions or excretions, such as sweat, saliva, sputum, nasal mucus, vomit, urine, or feces, on them.  Wear gloves when handling laundry from the patient.  Read and follow directions on labels of laundry or clothing items and detergent. In general, wash and dry with the warmest temperatures recommended on the label.  Clean all areas the individual has used often  Clean all touchable surfaces, such as counters, tabletops, doorknobs, bathroom fixtures, toilets, phones, keyboards, tablets, and bedside tables, every day. Also, clean any surfaces that may have blood, body fluids, and/or secretions or excretions on them.  Wear gloves when cleaning surfaces the patient has come in contact with.  Use a diluted bleach solution (e.g., dilute bleach with 1 part bleach and 10 parts water) or a household disinfectant with a label that says EPA-registered for coronaviruses. To make a bleach solution at home, add 1 tablespoon of bleach to 1 quart (4 cups) of water. For a larger supply, add  cup of bleach to 1 gallon (16 cups) of water.  Read labels of cleaning products and follow recommendations provided on product labels. Labels contain instructions for safe and effective use of the cleaning product including precautions you should take when applying the product, such as wearing gloves or eye protection and making sure you have good ventilation during use of the product.  Remove gloves and wash hands immediately after cleaning.  Monitor yourself for signs and symptoms of illness Caregivers and household members are considered close contacts, should monitor their health, and will be asked to limit movement outside of the home to the extent possible. Follow the monitoring steps for close contacts listed on the symptom monitoring form.   ? If you have additional questions, contact your local health department or call the epidemiologist on call at [phone] (available 24/7). ? This  guidance is subject to change. For the most up-to-date guidance from [facility], please refer to their website: [email or web address]

## 2019-05-26 NOTE — Care Management Important Message (Signed)
Important Message  Patient Details  Name: Virginia Chan MRN: [id number] Date of Birth: Oct 27, 1935   Medicare Important Message Given:  Yes - Important Message mailed due to current National Emergency  Verbal consent obtained due to current National Emergency  Relationship to patient: Spouse/Significant Other Contact Name: Cordasia Steuart Call Date: 05/26/19  Time: 1224 Phone: [phone] Outcome: Spoke with contact Important Message mailed to: [name]    Delorse Lek 05/26/2019, 12:24 PM

## 2019-05-28 DIAGNOSIS — N183 Chronic kidney disease, stage 3 unspecified: Secondary | ICD-10-CM | POA: Diagnosis not present

## 2019-05-28 DIAGNOSIS — U071 COVID-19: Secondary | ICD-10-CM | POA: Diagnosis not present

## 2019-05-28 DIAGNOSIS — I129 Hypertensive chronic kidney disease with stage 1 through stage 4 chronic kidney disease, or unspecified chronic kidney disease: Secondary | ICD-10-CM | POA: Diagnosis not present

## 2019-05-28 DIAGNOSIS — Z9181 History of falling: Secondary | ICD-10-CM | POA: Diagnosis not present

## 2019-05-28 DIAGNOSIS — J1289 Other viral pneumonia: Secondary | ICD-10-CM | POA: Diagnosis not present

## 2019-05-28 DIAGNOSIS — Z466 Encounter for fitting and adjustment of urinary device: Secondary | ICD-10-CM | POA: Diagnosis not present

## 2019-05-28 DIAGNOSIS — M6281 Muscle weakness (generalized): Secondary | ICD-10-CM | POA: Diagnosis not present

## 2019-05-28 DIAGNOSIS — R339 Retention of urine, unspecified: Secondary | ICD-10-CM | POA: Diagnosis not present

## 2019-05-29 DIAGNOSIS — Z978 Presence of other specified devices: Secondary | ICD-10-CM | POA: Diagnosis not present

## 2019-05-29 DIAGNOSIS — J129 Viral pneumonia, unspecified: Secondary | ICD-10-CM | POA: Diagnosis not present

## 2019-05-29 DIAGNOSIS — D519 Vitamin B12 deficiency anemia, unspecified: Secondary | ICD-10-CM | POA: Diagnosis not present

## 2019-05-29 DIAGNOSIS — Z1331 Encounter for screening for depression: Secondary | ICD-10-CM | POA: Diagnosis not present

## 2019-05-29 DIAGNOSIS — I129 Hypertensive chronic kidney disease with stage 1 through stage 4 chronic kidney disease, or unspecified chronic kidney disease: Secondary | ICD-10-CM | POA: Diagnosis not present

## 2019-05-29 DIAGNOSIS — N183 Chronic kidney disease, stage 3 unspecified: Secondary | ICD-10-CM | POA: Diagnosis not present

## 2019-05-29 DIAGNOSIS — D6489 Other specified anemias: Secondary | ICD-10-CM | POA: Diagnosis not present

## 2019-05-29 DIAGNOSIS — Z6824 Body mass index (BMI) 24.0-24.9, adult: Secondary | ICD-10-CM | POA: Diagnosis not present

## 2019-05-29 DIAGNOSIS — I10 Essential (primary) hypertension: Secondary | ICD-10-CM | POA: Diagnosis not present

## 2019-05-29 DIAGNOSIS — J9691 Respiratory failure, unspecified with hypoxia: Secondary | ICD-10-CM | POA: Diagnosis not present

## 2019-05-29 DIAGNOSIS — Z79899 Other long term (current) drug therapy: Secondary | ICD-10-CM | POA: Diagnosis not present

## 2019-05-29 DIAGNOSIS — R339 Retention of urine, unspecified: Secondary | ICD-10-CM | POA: Diagnosis not present

## 2019-05-29 DIAGNOSIS — R7989 Other specified abnormal findings of blood chemistry: Secondary | ICD-10-CM | POA: Diagnosis not present

## 2019-05-29 DIAGNOSIS — J1289 Other viral pneumonia: Secondary | ICD-10-CM | POA: Diagnosis not present

## 2019-05-29 DIAGNOSIS — G479 Sleep disorder, unspecified: Secondary | ICD-10-CM | POA: Diagnosis not present

## 2019-05-29 DIAGNOSIS — Z9981 Dependence on supplemental oxygen: Secondary | ICD-10-CM | POA: Diagnosis not present

## 2019-05-29 DIAGNOSIS — U071 COVID-19: Secondary | ICD-10-CM | POA: Diagnosis not present

## 2019-06-03 DIAGNOSIS — U071 COVID-19: Secondary | ICD-10-CM | POA: Diagnosis not present

## 2019-06-09 DIAGNOSIS — J1289 Other viral pneumonia: Secondary | ICD-10-CM | POA: Diagnosis not present

## 2019-06-09 DIAGNOSIS — Z466 Encounter for fitting and adjustment of urinary device: Secondary | ICD-10-CM | POA: Diagnosis not present

## 2019-06-09 DIAGNOSIS — R339 Retention of urine, unspecified: Secondary | ICD-10-CM | POA: Diagnosis not present

## 2019-06-09 DIAGNOSIS — M6281 Muscle weakness (generalized): Secondary | ICD-10-CM | POA: Diagnosis not present

## 2019-06-09 DIAGNOSIS — U071 COVID-19: Secondary | ICD-10-CM | POA: Diagnosis not present

## 2019-06-09 DIAGNOSIS — Z9181 History of falling: Secondary | ICD-10-CM | POA: Diagnosis not present

## 2019-06-09 DIAGNOSIS — I129 Hypertensive chronic kidney disease with stage 1 through stage 4 chronic kidney disease, or unspecified chronic kidney disease: Secondary | ICD-10-CM | POA: Diagnosis not present

## 2019-06-09 DIAGNOSIS — N183 Chronic kidney disease, stage 3 unspecified: Secondary | ICD-10-CM | POA: Diagnosis not present

## 2019-06-12 DIAGNOSIS — I129 Hypertensive chronic kidney disease with stage 1 through stage 4 chronic kidney disease, or unspecified chronic kidney disease: Secondary | ICD-10-CM | POA: Diagnosis not present

## 2019-06-12 DIAGNOSIS — R339 Retention of urine, unspecified: Secondary | ICD-10-CM | POA: Diagnosis not present

## 2019-06-12 DIAGNOSIS — N183 Chronic kidney disease, stage 3 unspecified: Secondary | ICD-10-CM | POA: Diagnosis not present

## 2019-06-16 DIAGNOSIS — Y708 Miscellaneous anesthesiology devices associated with adverse incidents, not elsewhere classified: Secondary | ICD-10-CM | POA: Diagnosis not present

## 2019-06-16 DIAGNOSIS — Z9981 Dependence on supplemental oxygen: Secondary | ICD-10-CM | POA: Diagnosis not present

## 2019-06-16 DIAGNOSIS — U071 COVID-19: Secondary | ICD-10-CM | POA: Diagnosis not present

## 2019-06-16 DIAGNOSIS — Z6821 Body mass index (BMI) 21.0-21.9, adult: Secondary | ICD-10-CM | POA: Diagnosis not present

## 2019-06-16 DIAGNOSIS — R197 Diarrhea, unspecified: Secondary | ICD-10-CM | POA: Diagnosis not present

## 2019-06-16 DIAGNOSIS — J9691 Respiratory failure, unspecified with hypoxia: Secondary | ICD-10-CM | POA: Diagnosis not present

## 2019-06-16 DIAGNOSIS — G479 Sleep disorder, unspecified: Secondary | ICD-10-CM | POA: Diagnosis not present

## 2019-06-16 DIAGNOSIS — N390 Urinary tract infection, site not specified: Secondary | ICD-10-CM | POA: Diagnosis not present

## 2019-06-17 DIAGNOSIS — U071 COVID-19: Secondary | ICD-10-CM | POA: Diagnosis not present

## 2019-06-18 DIAGNOSIS — I129 Hypertensive chronic kidney disease with stage 1 through stage 4 chronic kidney disease, or unspecified chronic kidney disease: Secondary | ICD-10-CM | POA: Diagnosis not present

## 2019-06-18 DIAGNOSIS — N183 Chronic kidney disease, stage 3 unspecified: Secondary | ICD-10-CM | POA: Diagnosis not present

## 2019-06-18 DIAGNOSIS — U071 COVID-19: Secondary | ICD-10-CM | POA: Diagnosis not present

## 2019-06-18 DIAGNOSIS — R339 Retention of urine, unspecified: Secondary | ICD-10-CM | POA: Diagnosis not present

## 2019-06-18 DIAGNOSIS — Z9181 History of falling: Secondary | ICD-10-CM | POA: Diagnosis not present

## 2019-06-18 DIAGNOSIS — Z466 Encounter for fitting and adjustment of urinary device: Secondary | ICD-10-CM | POA: Diagnosis not present

## 2019-06-18 DIAGNOSIS — M6281 Muscle weakness (generalized): Secondary | ICD-10-CM | POA: Diagnosis not present

## 2019-06-18 DIAGNOSIS — J1289 Other viral pneumonia: Secondary | ICD-10-CM | POA: Diagnosis not present

## 2019-06-23 DIAGNOSIS — U071 COVID-19: Secondary | ICD-10-CM | POA: Diagnosis not present

## 2019-06-27 DIAGNOSIS — Z466 Encounter for fitting and adjustment of urinary device: Secondary | ICD-10-CM | POA: Diagnosis not present

## 2019-06-27 DIAGNOSIS — U071 COVID-19: Secondary | ICD-10-CM | POA: Diagnosis not present

## 2019-06-27 DIAGNOSIS — Z9181 History of falling: Secondary | ICD-10-CM | POA: Diagnosis not present

## 2019-06-27 DIAGNOSIS — I129 Hypertensive chronic kidney disease with stage 1 through stage 4 chronic kidney disease, or unspecified chronic kidney disease: Secondary | ICD-10-CM | POA: Diagnosis not present

## 2019-06-27 DIAGNOSIS — J1289 Other viral pneumonia: Secondary | ICD-10-CM | POA: Diagnosis not present

## 2019-06-27 DIAGNOSIS — R339 Retention of urine, unspecified: Secondary | ICD-10-CM | POA: Diagnosis not present

## 2019-06-27 DIAGNOSIS — N183 Chronic kidney disease, stage 3 unspecified: Secondary | ICD-10-CM | POA: Diagnosis not present

## 2019-06-27 DIAGNOSIS — M6281 Muscle weakness (generalized): Secondary | ICD-10-CM | POA: Diagnosis not present

## 2019-07-02 DIAGNOSIS — R339 Retention of urine, unspecified: Secondary | ICD-10-CM | POA: Diagnosis not present

## 2019-07-02 DIAGNOSIS — M6281 Muscle weakness (generalized): Secondary | ICD-10-CM | POA: Diagnosis not present

## 2019-07-02 DIAGNOSIS — I129 Hypertensive chronic kidney disease with stage 1 through stage 4 chronic kidney disease, or unspecified chronic kidney disease: Secondary | ICD-10-CM | POA: Diagnosis not present

## 2019-07-02 DIAGNOSIS — J1289 Other viral pneumonia: Secondary | ICD-10-CM | POA: Diagnosis not present

## 2019-07-02 DIAGNOSIS — Z466 Encounter for fitting and adjustment of urinary device: Secondary | ICD-10-CM | POA: Diagnosis not present

## 2019-07-02 DIAGNOSIS — Z9181 History of falling: Secondary | ICD-10-CM | POA: Diagnosis not present

## 2019-07-02 DIAGNOSIS — U071 COVID-19: Secondary | ICD-10-CM | POA: Diagnosis not present

## 2019-07-02 DIAGNOSIS — N183 Chronic kidney disease, stage 3 unspecified: Secondary | ICD-10-CM | POA: Diagnosis not present

## 2019-07-03 ENCOUNTER — Other Ambulatory Visit: Payer: Self-pay

## 2019-07-03 DIAGNOSIS — R339 Retention of urine, unspecified: Secondary | ICD-10-CM | POA: Diagnosis not present

## 2019-07-03 DIAGNOSIS — N390 Urinary tract infection, site not specified: Secondary | ICD-10-CM | POA: Diagnosis not present

## 2019-07-04 DIAGNOSIS — M6281 Muscle weakness (generalized): Secondary | ICD-10-CM | POA: Diagnosis not present

## 2019-07-04 DIAGNOSIS — Z466 Encounter for fitting and adjustment of urinary device: Secondary | ICD-10-CM | POA: Diagnosis not present

## 2019-07-04 DIAGNOSIS — U071 COVID-19: Secondary | ICD-10-CM | POA: Diagnosis not present

## 2019-07-04 DIAGNOSIS — I129 Hypertensive chronic kidney disease with stage 1 through stage 4 chronic kidney disease, or unspecified chronic kidney disease: Secondary | ICD-10-CM | POA: Diagnosis not present

## 2019-07-04 DIAGNOSIS — R339 Retention of urine, unspecified: Secondary | ICD-10-CM | POA: Diagnosis not present

## 2019-07-04 DIAGNOSIS — J1289 Other viral pneumonia: Secondary | ICD-10-CM | POA: Diagnosis not present

## 2019-07-04 DIAGNOSIS — N183 Chronic kidney disease, stage 3 unspecified: Secondary | ICD-10-CM | POA: Diagnosis not present

## 2019-07-04 DIAGNOSIS — Z9181 History of falling: Secondary | ICD-10-CM | POA: Diagnosis not present

## 2019-07-10 DIAGNOSIS — M4722 Other spondylosis with radiculopathy, cervical region: Secondary | ICD-10-CM | POA: Diagnosis not present

## 2019-07-10 DIAGNOSIS — M9904 Segmental and somatic dysfunction of sacral region: Secondary | ICD-10-CM | POA: Diagnosis not present

## 2019-07-10 DIAGNOSIS — M9902 Segmental and somatic dysfunction of thoracic region: Secondary | ICD-10-CM | POA: Diagnosis not present

## 2019-07-10 DIAGNOSIS — M4724 Other spondylosis with radiculopathy, thoracic region: Secondary | ICD-10-CM | POA: Diagnosis not present

## 2019-07-10 DIAGNOSIS — M9901 Segmental and somatic dysfunction of cervical region: Secondary | ICD-10-CM | POA: Diagnosis not present

## 2019-07-10 DIAGNOSIS — M5388 Other specified dorsopathies, sacral and sacrococcygeal region: Secondary | ICD-10-CM | POA: Diagnosis not present

## 2019-07-17 DIAGNOSIS — M9901 Segmental and somatic dysfunction of cervical region: Secondary | ICD-10-CM | POA: Diagnosis not present

## 2019-07-17 DIAGNOSIS — M9904 Segmental and somatic dysfunction of sacral region: Secondary | ICD-10-CM | POA: Diagnosis not present

## 2019-07-17 DIAGNOSIS — M4722 Other spondylosis with radiculopathy, cervical region: Secondary | ICD-10-CM | POA: Diagnosis not present

## 2019-07-17 DIAGNOSIS — M9902 Segmental and somatic dysfunction of thoracic region: Secondary | ICD-10-CM | POA: Diagnosis not present

## 2019-07-17 DIAGNOSIS — M5388 Other specified dorsopathies, sacral and sacrococcygeal region: Secondary | ICD-10-CM | POA: Diagnosis not present

## 2019-07-17 DIAGNOSIS — M4724 Other spondylosis with radiculopathy, thoracic region: Secondary | ICD-10-CM | POA: Diagnosis not present

## 2019-07-24 DIAGNOSIS — M9902 Segmental and somatic dysfunction of thoracic region: Secondary | ICD-10-CM | POA: Diagnosis not present

## 2019-07-24 DIAGNOSIS — M5388 Other specified dorsopathies, sacral and sacrococcygeal region: Secondary | ICD-10-CM | POA: Diagnosis not present

## 2019-07-24 DIAGNOSIS — M4722 Other spondylosis with radiculopathy, cervical region: Secondary | ICD-10-CM | POA: Diagnosis not present

## 2019-07-24 DIAGNOSIS — M9901 Segmental and somatic dysfunction of cervical region: Secondary | ICD-10-CM | POA: Diagnosis not present

## 2019-07-24 DIAGNOSIS — M9904 Segmental and somatic dysfunction of sacral region: Secondary | ICD-10-CM | POA: Diagnosis not present

## 2019-07-24 DIAGNOSIS — M4724 Other spondylosis with radiculopathy, thoracic region: Secondary | ICD-10-CM | POA: Diagnosis not present

## 2019-07-25 DIAGNOSIS — I2510 Atherosclerotic heart disease of native coronary artery without angina pectoris: Secondary | ICD-10-CM | POA: Diagnosis not present

## 2019-07-25 DIAGNOSIS — R918 Other nonspecific abnormal finding of lung field: Secondary | ICD-10-CM | POA: Diagnosis not present

## 2019-07-25 DIAGNOSIS — J479 Bronchiectasis, uncomplicated: Secondary | ICD-10-CM | POA: Diagnosis not present

## 2019-07-25 DIAGNOSIS — I700 Atherosclerosis of aorta: Secondary | ICD-10-CM | POA: Diagnosis not present

## 2019-07-25 DIAGNOSIS — I313 Pericardial effusion (noninflammatory): Secondary | ICD-10-CM | POA: Diagnosis not present

## 2019-07-28 DIAGNOSIS — U071 COVID-19: Secondary | ICD-10-CM | POA: Diagnosis not present

## 2019-08-04 DIAGNOSIS — M9901 Segmental and somatic dysfunction of cervical region: Secondary | ICD-10-CM | POA: Diagnosis not present

## 2019-08-04 DIAGNOSIS — M9902 Segmental and somatic dysfunction of thoracic region: Secondary | ICD-10-CM | POA: Diagnosis not present

## 2019-08-04 DIAGNOSIS — M9904 Segmental and somatic dysfunction of sacral region: Secondary | ICD-10-CM | POA: Diagnosis not present

## 2019-08-04 DIAGNOSIS — M4722 Other spondylosis with radiculopathy, cervical region: Secondary | ICD-10-CM | POA: Diagnosis not present

## 2019-08-04 DIAGNOSIS — M5388 Other specified dorsopathies, sacral and sacrococcygeal region: Secondary | ICD-10-CM | POA: Diagnosis not present

## 2019-08-04 DIAGNOSIS — M4724 Other spondylosis with radiculopathy, thoracic region: Secondary | ICD-10-CM | POA: Diagnosis not present

## 2019-08-05 DIAGNOSIS — I361 Nonrheumatic tricuspid (valve) insufficiency: Secondary | ICD-10-CM | POA: Diagnosis not present

## 2019-08-05 DIAGNOSIS — R0600 Dyspnea, unspecified: Secondary | ICD-10-CM | POA: Diagnosis not present

## 2019-08-15 DIAGNOSIS — R339 Retention of urine, unspecified: Secondary | ICD-10-CM | POA: Diagnosis not present

## 2019-08-15 DIAGNOSIS — N390 Urinary tract infection, site not specified: Secondary | ICD-10-CM | POA: Diagnosis not present

## 2019-08-18 DIAGNOSIS — M9902 Segmental and somatic dysfunction of thoracic region: Secondary | ICD-10-CM | POA: Diagnosis not present

## 2019-08-18 DIAGNOSIS — M4724 Other spondylosis with radiculopathy, thoracic region: Secondary | ICD-10-CM | POA: Diagnosis not present

## 2019-08-18 DIAGNOSIS — M4722 Other spondylosis with radiculopathy, cervical region: Secondary | ICD-10-CM | POA: Diagnosis not present

## 2019-08-18 DIAGNOSIS — M9904 Segmental and somatic dysfunction of sacral region: Secondary | ICD-10-CM | POA: Diagnosis not present

## 2019-08-18 DIAGNOSIS — M4723 Other spondylosis with radiculopathy, cervicothoracic region: Secondary | ICD-10-CM | POA: Diagnosis not present

## 2019-08-18 DIAGNOSIS — M9901 Segmental and somatic dysfunction of cervical region: Secondary | ICD-10-CM | POA: Diagnosis not present

## 2019-08-18 DIAGNOSIS — M5388 Other specified dorsopathies, sacral and sacrococcygeal region: Secondary | ICD-10-CM | POA: Diagnosis not present

## 2019-08-29 DIAGNOSIS — Z1231 Encounter for screening mammogram for malignant neoplasm of breast: Secondary | ICD-10-CM | POA: Diagnosis not present

## 2019-09-02 DIAGNOSIS — M9901 Segmental and somatic dysfunction of cervical region: Secondary | ICD-10-CM | POA: Diagnosis not present

## 2019-09-02 DIAGNOSIS — M4723 Other spondylosis with radiculopathy, cervicothoracic region: Secondary | ICD-10-CM | POA: Diagnosis not present

## 2019-09-02 DIAGNOSIS — M47894 Other spondylosis, thoracic region: Secondary | ICD-10-CM | POA: Diagnosis not present

## 2019-09-02 DIAGNOSIS — M9904 Segmental and somatic dysfunction of sacral region: Secondary | ICD-10-CM | POA: Diagnosis not present

## 2019-09-02 DIAGNOSIS — M9902 Segmental and somatic dysfunction of thoracic region: Secondary | ICD-10-CM | POA: Diagnosis not present

## 2019-09-02 DIAGNOSIS — M5388 Other specified dorsopathies, sacral and sacrococcygeal region: Secondary | ICD-10-CM | POA: Diagnosis not present

## 2019-09-11 DIAGNOSIS — H401132 Primary open-angle glaucoma, bilateral, moderate stage: Secondary | ICD-10-CM | POA: Diagnosis not present

## 2019-09-24 DIAGNOSIS — Z8616 Personal history of COVID-19: Secondary | ICD-10-CM | POA: Diagnosis not present

## 2019-09-24 DIAGNOSIS — H401132 Primary open-angle glaucoma, bilateral, moderate stage: Secondary | ICD-10-CM | POA: Diagnosis not present

## 2019-09-24 DIAGNOSIS — R0600 Dyspnea, unspecified: Secondary | ICD-10-CM | POA: Diagnosis not present

## 2019-09-25 DIAGNOSIS — M9904 Segmental and somatic dysfunction of sacral region: Secondary | ICD-10-CM | POA: Diagnosis not present

## 2019-09-25 DIAGNOSIS — M5388 Other specified dorsopathies, sacral and sacrococcygeal region: Secondary | ICD-10-CM | POA: Diagnosis not present

## 2019-09-25 DIAGNOSIS — M4723 Other spondylosis with radiculopathy, cervicothoracic region: Secondary | ICD-10-CM | POA: Diagnosis not present

## 2019-09-25 DIAGNOSIS — M9902 Segmental and somatic dysfunction of thoracic region: Secondary | ICD-10-CM | POA: Diagnosis not present

## 2019-09-25 DIAGNOSIS — M4724 Other spondylosis with radiculopathy, thoracic region: Secondary | ICD-10-CM | POA: Diagnosis not present

## 2019-09-25 DIAGNOSIS — M9901 Segmental and somatic dysfunction of cervical region: Secondary | ICD-10-CM | POA: Diagnosis not present

## 2019-09-29 DIAGNOSIS — Z8616 Personal history of COVID-19: Secondary | ICD-10-CM | POA: Diagnosis not present

## 2019-09-29 DIAGNOSIS — R0600 Dyspnea, unspecified: Secondary | ICD-10-CM | POA: Diagnosis not present

## 2019-10-13 DIAGNOSIS — R233 Spontaneous ecchymoses: Secondary | ICD-10-CM | POA: Diagnosis not present

## 2019-10-13 DIAGNOSIS — L853 Xerosis cutis: Secondary | ICD-10-CM | POA: Diagnosis not present

## 2019-10-13 DIAGNOSIS — L570 Actinic keratosis: Secondary | ICD-10-CM | POA: Diagnosis not present

## 2019-10-30 DIAGNOSIS — M9904 Segmental and somatic dysfunction of sacral region: Secondary | ICD-10-CM | POA: Diagnosis not present

## 2019-10-30 DIAGNOSIS — M9902 Segmental and somatic dysfunction of thoracic region: Secondary | ICD-10-CM | POA: Diagnosis not present

## 2019-10-30 DIAGNOSIS — M47893 Other spondylosis, cervicothoracic region: Secondary | ICD-10-CM | POA: Diagnosis not present

## 2019-10-30 DIAGNOSIS — M5388 Other specified dorsopathies, sacral and sacrococcygeal region: Secondary | ICD-10-CM | POA: Diagnosis not present

## 2019-12-03 DIAGNOSIS — B3781 Candidal esophagitis: Secondary | ICD-10-CM | POA: Diagnosis not present

## 2019-12-03 DIAGNOSIS — Z6822 Body mass index (BMI) 22.0-22.9, adult: Secondary | ICD-10-CM | POA: Diagnosis not present

## 2019-12-03 DIAGNOSIS — B370 Candidal stomatitis: Secondary | ICD-10-CM | POA: Diagnosis not present

## 2019-12-03 DIAGNOSIS — I700 Atherosclerosis of aorta: Secondary | ICD-10-CM | POA: Diagnosis not present

## 2019-12-03 DIAGNOSIS — E785 Hyperlipidemia, unspecified: Secondary | ICD-10-CM | POA: Diagnosis not present

## 2019-12-03 DIAGNOSIS — M159 Polyosteoarthritis, unspecified: Secondary | ICD-10-CM | POA: Diagnosis not present

## 2019-12-03 DIAGNOSIS — K219 Gastro-esophageal reflux disease without esophagitis: Secondary | ICD-10-CM | POA: Diagnosis not present

## 2019-12-03 DIAGNOSIS — I2510 Atherosclerotic heart disease of native coronary artery without angina pectoris: Secondary | ICD-10-CM | POA: Diagnosis not present

## 2019-12-03 DIAGNOSIS — I110 Hypertensive heart disease with heart failure: Secondary | ICD-10-CM | POA: Diagnosis not present

## 2019-12-03 DIAGNOSIS — I10 Essential (primary) hypertension: Secondary | ICD-10-CM | POA: Diagnosis not present

## 2019-12-03 DIAGNOSIS — I5032 Chronic diastolic (congestive) heart failure: Secondary | ICD-10-CM | POA: Diagnosis not present

## 2019-12-03 DIAGNOSIS — I2584 Coronary atherosclerosis due to calcified coronary lesion: Secondary | ICD-10-CM | POA: Diagnosis not present

## 2019-12-04 DIAGNOSIS — M9904 Segmental and somatic dysfunction of sacral region: Secondary | ICD-10-CM | POA: Diagnosis not present

## 2019-12-04 DIAGNOSIS — M9901 Segmental and somatic dysfunction of cervical region: Secondary | ICD-10-CM | POA: Diagnosis not present

## 2019-12-04 DIAGNOSIS — M5388 Other specified dorsopathies, sacral and sacrococcygeal region: Secondary | ICD-10-CM | POA: Diagnosis not present

## 2019-12-04 DIAGNOSIS — M9905 Segmental and somatic dysfunction of pelvic region: Secondary | ICD-10-CM | POA: Diagnosis not present

## 2019-12-04 DIAGNOSIS — M4302 Spondylolysis, cervical region: Secondary | ICD-10-CM | POA: Diagnosis not present

## 2019-12-04 DIAGNOSIS — M4727 Other spondylosis with radiculopathy, lumbosacral region: Secondary | ICD-10-CM | POA: Diagnosis not present

## 2019-12-11 DIAGNOSIS — Z79899 Other long term (current) drug therapy: Secondary | ICD-10-CM | POA: Diagnosis not present

## 2019-12-11 DIAGNOSIS — R7309 Other abnormal glucose: Secondary | ICD-10-CM | POA: Diagnosis not present

## 2019-12-11 DIAGNOSIS — E785 Hyperlipidemia, unspecified: Secondary | ICD-10-CM | POA: Diagnosis not present

## 2019-12-23 DIAGNOSIS — M9901 Segmental and somatic dysfunction of cervical region: Secondary | ICD-10-CM | POA: Diagnosis not present

## 2019-12-23 DIAGNOSIS — N390 Urinary tract infection, site not specified: Secondary | ICD-10-CM | POA: Diagnosis not present

## 2019-12-23 DIAGNOSIS — M9904 Segmental and somatic dysfunction of sacral region: Secondary | ICD-10-CM | POA: Diagnosis not present

## 2019-12-23 DIAGNOSIS — R339 Retention of urine, unspecified: Secondary | ICD-10-CM | POA: Diagnosis not present

## 2019-12-23 DIAGNOSIS — M9905 Segmental and somatic dysfunction of pelvic region: Secondary | ICD-10-CM | POA: Diagnosis not present

## 2019-12-23 DIAGNOSIS — M4302 Spondylolysis, cervical region: Secondary | ICD-10-CM | POA: Diagnosis not present

## 2019-12-23 DIAGNOSIS — M5388 Other specified dorsopathies, sacral and sacrococcygeal region: Secondary | ICD-10-CM | POA: Diagnosis not present

## 2019-12-23 DIAGNOSIS — M4724 Other spondylosis with radiculopathy, thoracic region: Secondary | ICD-10-CM | POA: Diagnosis not present

## 2019-12-23 DIAGNOSIS — M9902 Segmental and somatic dysfunction of thoracic region: Secondary | ICD-10-CM | POA: Diagnosis not present

## 2019-12-23 DIAGNOSIS — M4722 Other spondylosis with radiculopathy, cervical region: Secondary | ICD-10-CM | POA: Diagnosis not present

## 2019-12-23 DIAGNOSIS — M4727 Other spondylosis with radiculopathy, lumbosacral region: Secondary | ICD-10-CM | POA: Diagnosis not present

## 2019-12-31 DIAGNOSIS — Z961 Presence of intraocular lens: Secondary | ICD-10-CM | POA: Diagnosis not present

## 2019-12-31 DIAGNOSIS — H4020X2 Unspecified primary angle-closure glaucoma, moderate stage: Secondary | ICD-10-CM | POA: Diagnosis not present

## 2020-01-15 DIAGNOSIS — M5388 Other specified dorsopathies, sacral and sacrococcygeal region: Secondary | ICD-10-CM | POA: Diagnosis not present

## 2020-01-15 DIAGNOSIS — M47893 Other spondylosis, cervicothoracic region: Secondary | ICD-10-CM | POA: Diagnosis not present

## 2020-01-15 DIAGNOSIS — M9902 Segmental and somatic dysfunction of thoracic region: Secondary | ICD-10-CM | POA: Diagnosis not present

## 2020-01-15 DIAGNOSIS — M9901 Segmental and somatic dysfunction of cervical region: Secondary | ICD-10-CM | POA: Diagnosis not present

## 2020-01-15 DIAGNOSIS — M9904 Segmental and somatic dysfunction of sacral region: Secondary | ICD-10-CM | POA: Diagnosis not present

## 2020-01-15 DIAGNOSIS — M5134 Other intervertebral disc degeneration, thoracic region: Secondary | ICD-10-CM | POA: Diagnosis not present

## 2020-01-21 DIAGNOSIS — H4020X2 Unspecified primary angle-closure glaucoma, moderate stage: Secondary | ICD-10-CM | POA: Diagnosis not present

## 2020-02-05 DIAGNOSIS — M5388 Other specified dorsopathies, sacral and sacrococcygeal region: Secondary | ICD-10-CM | POA: Diagnosis not present

## 2020-02-05 DIAGNOSIS — M9901 Segmental and somatic dysfunction of cervical region: Secondary | ICD-10-CM | POA: Diagnosis not present

## 2020-02-05 DIAGNOSIS — M9902 Segmental and somatic dysfunction of thoracic region: Secondary | ICD-10-CM | POA: Diagnosis not present

## 2020-02-05 DIAGNOSIS — M4724 Other spondylosis with radiculopathy, thoracic region: Secondary | ICD-10-CM | POA: Diagnosis not present

## 2020-02-05 DIAGNOSIS — M4722 Other spondylosis with radiculopathy, cervical region: Secondary | ICD-10-CM | POA: Diagnosis not present

## 2020-02-05 DIAGNOSIS — M9904 Segmental and somatic dysfunction of sacral region: Secondary | ICD-10-CM | POA: Diagnosis not present

## 2020-02-10 IMAGING — DX DG CHEST 1V PORT
1 series · 1 of 1 positions shown · non-contrast
Comparison: 05/17/2019

CLINICAL DATA: Shortness of breath.  1Z1DS-R3.

EXAM:
PORTABLE CHEST 1 VIEW

[chest]
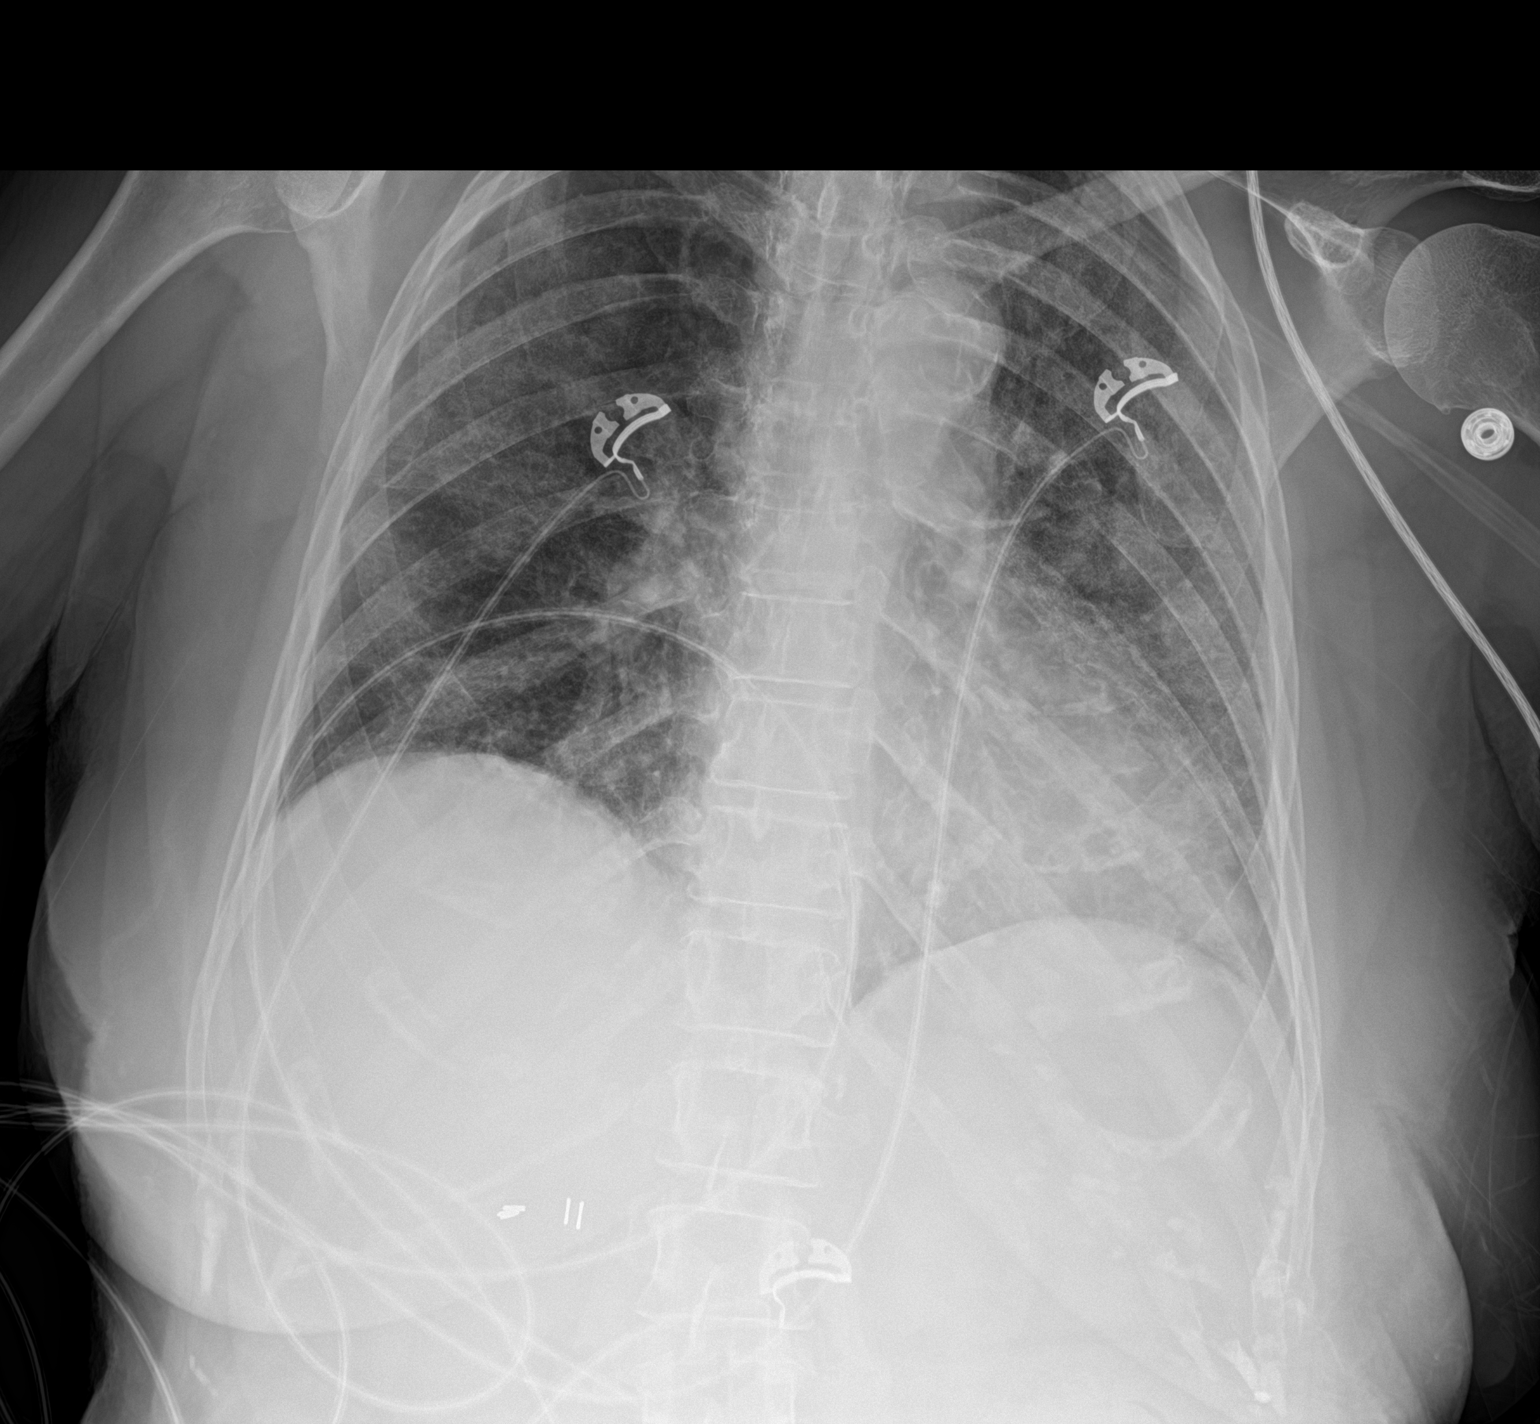

[1 of 1 positions shown; findings below may reference images not displayed]

FINDINGS: Midline trachea. Normal heart size. Atherosclerosis in the
transverse aorta. No pleural effusion or pneumothorax. Biapical
pleural thickening. Given differences in technique, similar patchy
interstitial opacities bilaterally. Relative sparing of the right
lung base. Mild right hemidiaphragm elevation. Surgical clips in the
right upper quadrant.
IMPRESSION: Similar bilateral interstitial opacities, most consistent with
1Z1DS-R3 pneumonia.

Aortic Atherosclerosis ([id number]-SRR.R).

## 2020-02-26 DIAGNOSIS — M47813 Spondylosis without myelopathy or radiculopathy, cervicothoracic region: Secondary | ICD-10-CM | POA: Diagnosis not present

## 2020-02-26 DIAGNOSIS — M9904 Segmental and somatic dysfunction of sacral region: Secondary | ICD-10-CM | POA: Diagnosis not present

## 2020-02-26 DIAGNOSIS — M9903 Segmental and somatic dysfunction of lumbar region: Secondary | ICD-10-CM | POA: Diagnosis not present

## 2020-02-26 DIAGNOSIS — M4727 Other spondylosis with radiculopathy, lumbosacral region: Secondary | ICD-10-CM | POA: Diagnosis not present

## 2020-02-26 DIAGNOSIS — M5388 Other specified dorsopathies, sacral and sacrococcygeal region: Secondary | ICD-10-CM | POA: Diagnosis not present

## 2020-02-26 DIAGNOSIS — M9901 Segmental and somatic dysfunction of cervical region: Secondary | ICD-10-CM | POA: Diagnosis not present

## 2020-03-18 DIAGNOSIS — M9903 Segmental and somatic dysfunction of lumbar region: Secondary | ICD-10-CM | POA: Diagnosis not present

## 2020-03-18 DIAGNOSIS — M9901 Segmental and somatic dysfunction of cervical region: Secondary | ICD-10-CM | POA: Diagnosis not present

## 2020-03-18 DIAGNOSIS — M47813 Spondylosis without myelopathy or radiculopathy, cervicothoracic region: Secondary | ICD-10-CM | POA: Diagnosis not present

## 2020-03-18 DIAGNOSIS — M4727 Other spondylosis with radiculopathy, lumbosacral region: Secondary | ICD-10-CM | POA: Diagnosis not present

## 2020-03-18 DIAGNOSIS — M9904 Segmental and somatic dysfunction of sacral region: Secondary | ICD-10-CM | POA: Diagnosis not present

## 2020-03-18 DIAGNOSIS — M5388 Other specified dorsopathies, sacral and sacrococcygeal region: Secondary | ICD-10-CM | POA: Diagnosis not present

## 2020-04-21 DIAGNOSIS — Z961 Presence of intraocular lens: Secondary | ICD-10-CM | POA: Diagnosis not present

## 2020-04-21 DIAGNOSIS — H401132 Primary open-angle glaucoma, bilateral, moderate stage: Secondary | ICD-10-CM | POA: Diagnosis not present

## 2020-04-22 DIAGNOSIS — M9904 Segmental and somatic dysfunction of sacral region: Secondary | ICD-10-CM | POA: Diagnosis not present

## 2020-04-22 DIAGNOSIS — M47894 Other spondylosis, thoracic region: Secondary | ICD-10-CM | POA: Diagnosis not present

## 2020-04-22 DIAGNOSIS — M9902 Segmental and somatic dysfunction of thoracic region: Secondary | ICD-10-CM | POA: Diagnosis not present

## 2020-04-22 DIAGNOSIS — M47892 Other spondylosis, cervical region: Secondary | ICD-10-CM | POA: Diagnosis not present

## 2020-04-22 DIAGNOSIS — M5388 Other specified dorsopathies, sacral and sacrococcygeal region: Secondary | ICD-10-CM | POA: Diagnosis not present

## 2020-04-22 DIAGNOSIS — M9901 Segmental and somatic dysfunction of cervical region: Secondary | ICD-10-CM | POA: Diagnosis not present

## 2020-04-28 DIAGNOSIS — H527 Unspecified disorder of refraction: Secondary | ICD-10-CM | POA: Diagnosis not present

## 2020-05-19 DIAGNOSIS — K2960 Other gastritis without bleeding: Secondary | ICD-10-CM | POA: Diagnosis not present

## 2020-05-19 DIAGNOSIS — Z0000 Encounter for general adult medical examination without abnormal findings: Secondary | ICD-10-CM | POA: Diagnosis not present

## 2020-05-19 DIAGNOSIS — I2510 Atherosclerotic heart disease of native coronary artery without angina pectoris: Secondary | ICD-10-CM | POA: Diagnosis not present

## 2020-05-19 DIAGNOSIS — I2584 Coronary atherosclerosis due to calcified coronary lesion: Secondary | ICD-10-CM | POA: Diagnosis not present

## 2020-05-19 DIAGNOSIS — Z79899 Other long term (current) drug therapy: Secondary | ICD-10-CM | POA: Diagnosis not present

## 2020-05-19 DIAGNOSIS — R7302 Impaired glucose tolerance (oral): Secondary | ICD-10-CM | POA: Diagnosis not present

## 2020-05-19 DIAGNOSIS — G720 Drug-induced myopathy: Secondary | ICD-10-CM | POA: Diagnosis not present

## 2020-05-19 DIAGNOSIS — I5032 Chronic diastolic (congestive) heart failure: Secondary | ICD-10-CM | POA: Diagnosis not present

## 2020-05-19 DIAGNOSIS — I700 Atherosclerosis of aorta: Secondary | ICD-10-CM | POA: Diagnosis not present

## 2020-05-19 DIAGNOSIS — M1991 Primary osteoarthritis, unspecified site: Secondary | ICD-10-CM | POA: Diagnosis not present

## 2020-05-19 DIAGNOSIS — I110 Hypertensive heart disease with heart failure: Secondary | ICD-10-CM | POA: Diagnosis not present

## 2020-05-19 DIAGNOSIS — E785 Hyperlipidemia, unspecified: Secondary | ICD-10-CM | POA: Diagnosis not present

## 2020-05-31 DIAGNOSIS — M9903 Segmental and somatic dysfunction of lumbar region: Secondary | ICD-10-CM | POA: Diagnosis not present

## 2020-05-31 DIAGNOSIS — M9901 Segmental and somatic dysfunction of cervical region: Secondary | ICD-10-CM | POA: Diagnosis not present

## 2020-05-31 DIAGNOSIS — M47897 Other spondylosis, lumbosacral region: Secondary | ICD-10-CM | POA: Diagnosis not present

## 2020-05-31 DIAGNOSIS — M5388 Other specified dorsopathies, sacral and sacrococcygeal region: Secondary | ICD-10-CM | POA: Diagnosis not present

## 2020-05-31 DIAGNOSIS — M9904 Segmental and somatic dysfunction of sacral region: Secondary | ICD-10-CM | POA: Diagnosis not present

## 2020-05-31 DIAGNOSIS — M47812 Spondylosis without myelopathy or radiculopathy, cervical region: Secondary | ICD-10-CM | POA: Diagnosis not present

## 2020-06-23 DIAGNOSIS — N390 Urinary tract infection, site not specified: Secondary | ICD-10-CM | POA: Diagnosis not present

## 2020-06-23 DIAGNOSIS — R339 Retention of urine, unspecified: Secondary | ICD-10-CM | POA: Diagnosis not present

## 2020-07-21 DIAGNOSIS — M9902 Segmental and somatic dysfunction of thoracic region: Secondary | ICD-10-CM | POA: Diagnosis not present

## 2020-07-21 DIAGNOSIS — M5388 Other specified dorsopathies, sacral and sacrococcygeal region: Secondary | ICD-10-CM | POA: Diagnosis not present

## 2020-07-21 DIAGNOSIS — M4723 Other spondylosis with radiculopathy, cervicothoracic region: Secondary | ICD-10-CM | POA: Diagnosis not present

## 2020-07-21 DIAGNOSIS — M9904 Segmental and somatic dysfunction of sacral region: Secondary | ICD-10-CM | POA: Diagnosis not present

## 2020-07-21 DIAGNOSIS — M4304 Spondylolysis, thoracic region: Secondary | ICD-10-CM | POA: Diagnosis not present

## 2020-07-21 DIAGNOSIS — M9901 Segmental and somatic dysfunction of cervical region: Secondary | ICD-10-CM | POA: Diagnosis not present

## 2020-09-15 DIAGNOSIS — M9901 Segmental and somatic dysfunction of cervical region: Secondary | ICD-10-CM | POA: Diagnosis not present

## 2020-09-15 DIAGNOSIS — M4724 Other spondylosis with radiculopathy, thoracic region: Secondary | ICD-10-CM | POA: Diagnosis not present

## 2020-09-15 DIAGNOSIS — M9904 Segmental and somatic dysfunction of sacral region: Secondary | ICD-10-CM | POA: Diagnosis not present

## 2020-09-15 DIAGNOSIS — M4723 Other spondylosis with radiculopathy, cervicothoracic region: Secondary | ICD-10-CM | POA: Diagnosis not present

## 2020-09-15 DIAGNOSIS — M5388 Other specified dorsopathies, sacral and sacrococcygeal region: Secondary | ICD-10-CM | POA: Diagnosis not present

## 2020-09-15 DIAGNOSIS — M9902 Segmental and somatic dysfunction of thoracic region: Secondary | ICD-10-CM | POA: Diagnosis not present

## 2020-09-22 DIAGNOSIS — H401132 Primary open-angle glaucoma, bilateral, moderate stage: Secondary | ICD-10-CM | POA: Diagnosis not present

## 2020-09-28 DIAGNOSIS — L570 Actinic keratosis: Secondary | ICD-10-CM | POA: Diagnosis not present

## 2020-09-28 DIAGNOSIS — L814 Other melanin hyperpigmentation: Secondary | ICD-10-CM | POA: Diagnosis not present

## 2020-09-28 DIAGNOSIS — D485 Neoplasm of uncertain behavior of skin: Secondary | ICD-10-CM | POA: Diagnosis not present

## 2020-10-01 DIAGNOSIS — Z1231 Encounter for screening mammogram for malignant neoplasm of breast: Secondary | ICD-10-CM | POA: Diagnosis not present

## 2020-10-12 DIAGNOSIS — M5388 Other specified dorsopathies, sacral and sacrococcygeal region: Secondary | ICD-10-CM | POA: Diagnosis not present

## 2020-10-12 DIAGNOSIS — M4724 Other spondylosis with radiculopathy, thoracic region: Secondary | ICD-10-CM | POA: Diagnosis not present

## 2020-10-12 DIAGNOSIS — M9902 Segmental and somatic dysfunction of thoracic region: Secondary | ICD-10-CM | POA: Diagnosis not present

## 2020-10-12 DIAGNOSIS — M9901 Segmental and somatic dysfunction of cervical region: Secondary | ICD-10-CM | POA: Diagnosis not present

## 2020-10-12 DIAGNOSIS — C44329 Squamous cell carcinoma of skin of other parts of face: Secondary | ICD-10-CM | POA: Diagnosis not present

## 2020-10-12 DIAGNOSIS — M4723 Other spondylosis with radiculopathy, cervicothoracic region: Secondary | ICD-10-CM | POA: Diagnosis not present

## 2020-10-12 DIAGNOSIS — M9904 Segmental and somatic dysfunction of sacral region: Secondary | ICD-10-CM | POA: Diagnosis not present

## 2020-10-19 DIAGNOSIS — M4724 Other spondylosis with radiculopathy, thoracic region: Secondary | ICD-10-CM | POA: Diagnosis not present

## 2020-10-19 DIAGNOSIS — M9904 Segmental and somatic dysfunction of sacral region: Secondary | ICD-10-CM | POA: Diagnosis not present

## 2020-10-19 DIAGNOSIS — M4723 Other spondylosis with radiculopathy, cervicothoracic region: Secondary | ICD-10-CM | POA: Diagnosis not present

## 2020-10-19 DIAGNOSIS — M9902 Segmental and somatic dysfunction of thoracic region: Secondary | ICD-10-CM | POA: Diagnosis not present

## 2020-10-19 DIAGNOSIS — M5388 Other specified dorsopathies, sacral and sacrococcygeal region: Secondary | ICD-10-CM | POA: Diagnosis not present

## 2020-10-19 DIAGNOSIS — M9901 Segmental and somatic dysfunction of cervical region: Secondary | ICD-10-CM | POA: Diagnosis not present

## 2020-10-20 DIAGNOSIS — H401132 Primary open-angle glaucoma, bilateral, moderate stage: Secondary | ICD-10-CM | POA: Diagnosis not present

## 2020-11-01 DIAGNOSIS — M9904 Segmental and somatic dysfunction of sacral region: Secondary | ICD-10-CM | POA: Diagnosis not present

## 2020-11-01 DIAGNOSIS — M4724 Other spondylosis with radiculopathy, thoracic region: Secondary | ICD-10-CM | POA: Diagnosis not present

## 2020-11-01 DIAGNOSIS — M4723 Other spondylosis with radiculopathy, cervicothoracic region: Secondary | ICD-10-CM | POA: Diagnosis not present

## 2020-11-01 DIAGNOSIS — M9902 Segmental and somatic dysfunction of thoracic region: Secondary | ICD-10-CM | POA: Diagnosis not present

## 2020-11-01 DIAGNOSIS — M9901 Segmental and somatic dysfunction of cervical region: Secondary | ICD-10-CM | POA: Diagnosis not present

## 2020-11-01 DIAGNOSIS — M5388 Other specified dorsopathies, sacral and sacrococcygeal region: Secondary | ICD-10-CM | POA: Diagnosis not present

## 2020-11-17 DIAGNOSIS — M1991 Primary osteoarthritis, unspecified site: Secondary | ICD-10-CM | POA: Diagnosis not present

## 2020-11-17 DIAGNOSIS — I110 Hypertensive heart disease with heart failure: Secondary | ICD-10-CM | POA: Diagnosis not present

## 2020-11-17 DIAGNOSIS — S81812A Laceration without foreign body, left lower leg, initial encounter: Secondary | ICD-10-CM | POA: Diagnosis not present

## 2020-11-17 DIAGNOSIS — R7301 Impaired fasting glucose: Secondary | ICD-10-CM | POA: Diagnosis not present

## 2020-11-17 DIAGNOSIS — I25119 Atherosclerotic heart disease of native coronary artery with unspecified angina pectoris: Secondary | ICD-10-CM | POA: Diagnosis not present

## 2020-11-17 DIAGNOSIS — I700 Atherosclerosis of aorta: Secondary | ICD-10-CM | POA: Diagnosis not present

## 2020-11-17 DIAGNOSIS — I5032 Chronic diastolic (congestive) heart failure: Secondary | ICD-10-CM | POA: Diagnosis not present

## 2020-11-17 DIAGNOSIS — E539 Vitamin B deficiency, unspecified: Secondary | ICD-10-CM | POA: Diagnosis not present

## 2020-11-17 DIAGNOSIS — E785 Hyperlipidemia, unspecified: Secondary | ICD-10-CM | POA: Diagnosis not present

## 2020-11-17 DIAGNOSIS — Z23 Encounter for immunization: Secondary | ICD-10-CM | POA: Diagnosis not present

## 2020-11-17 DIAGNOSIS — G720 Drug-induced myopathy: Secondary | ICD-10-CM | POA: Diagnosis not present

## 2020-11-23 DIAGNOSIS — M9903 Segmental and somatic dysfunction of lumbar region: Secondary | ICD-10-CM | POA: Diagnosis not present

## 2020-11-23 DIAGNOSIS — M47814 Spondylosis without myelopathy or radiculopathy, thoracic region: Secondary | ICD-10-CM | POA: Diagnosis not present

## 2020-11-23 DIAGNOSIS — M47891 Other spondylosis, occipito-atlanto-axial region: Secondary | ICD-10-CM | POA: Diagnosis not present

## 2020-11-23 DIAGNOSIS — M47897 Other spondylosis, lumbosacral region: Secondary | ICD-10-CM | POA: Diagnosis not present

## 2020-11-23 DIAGNOSIS — M9901 Segmental and somatic dysfunction of cervical region: Secondary | ICD-10-CM | POA: Diagnosis not present

## 2020-11-23 DIAGNOSIS — M9902 Segmental and somatic dysfunction of thoracic region: Secondary | ICD-10-CM | POA: Diagnosis not present

## 2020-12-08 DIAGNOSIS — I5032 Chronic diastolic (congestive) heart failure: Secondary | ICD-10-CM | POA: Diagnosis not present

## 2020-12-08 DIAGNOSIS — H6121 Impacted cerumen, right ear: Secondary | ICD-10-CM | POA: Diagnosis not present

## 2020-12-08 DIAGNOSIS — Z6822 Body mass index (BMI) 22.0-22.9, adult: Secondary | ICD-10-CM | POA: Diagnosis not present

## 2020-12-08 DIAGNOSIS — I110 Hypertensive heart disease with heart failure: Secondary | ICD-10-CM | POA: Diagnosis not present

## 2020-12-21 DIAGNOSIS — M9902 Segmental and somatic dysfunction of thoracic region: Secondary | ICD-10-CM | POA: Diagnosis not present

## 2020-12-21 DIAGNOSIS — M47897 Other spondylosis, lumbosacral region: Secondary | ICD-10-CM | POA: Diagnosis not present

## 2020-12-21 DIAGNOSIS — M9903 Segmental and somatic dysfunction of lumbar region: Secondary | ICD-10-CM | POA: Diagnosis not present

## 2020-12-21 DIAGNOSIS — M47891 Other spondylosis, occipito-atlanto-axial region: Secondary | ICD-10-CM | POA: Diagnosis not present

## 2020-12-21 DIAGNOSIS — M9901 Segmental and somatic dysfunction of cervical region: Secondary | ICD-10-CM | POA: Diagnosis not present

## 2020-12-21 DIAGNOSIS — M47814 Spondylosis without myelopathy or radiculopathy, thoracic region: Secondary | ICD-10-CM | POA: Diagnosis not present

## 2020-12-22 DIAGNOSIS — N390 Urinary tract infection, site not specified: Secondary | ICD-10-CM | POA: Diagnosis not present

## 2020-12-22 DIAGNOSIS — R339 Retention of urine, unspecified: Secondary | ICD-10-CM | POA: Diagnosis not present

## 2021-01-11 DIAGNOSIS — D485 Neoplasm of uncertain behavior of skin: Secondary | ICD-10-CM | POA: Diagnosis not present

## 2021-01-11 DIAGNOSIS — M9902 Segmental and somatic dysfunction of thoracic region: Secondary | ICD-10-CM | POA: Diagnosis not present

## 2021-01-11 DIAGNOSIS — C44329 Squamous cell carcinoma of skin of other parts of face: Secondary | ICD-10-CM | POA: Diagnosis not present

## 2021-01-11 DIAGNOSIS — M47892 Other spondylosis, cervical region: Secondary | ICD-10-CM | POA: Diagnosis not present

## 2021-01-11 DIAGNOSIS — M9901 Segmental and somatic dysfunction of cervical region: Secondary | ICD-10-CM | POA: Diagnosis not present

## 2021-01-11 DIAGNOSIS — M4723 Other spondylosis with radiculopathy, cervicothoracic region: Secondary | ICD-10-CM | POA: Diagnosis not present

## 2021-01-11 DIAGNOSIS — M5388 Other specified dorsopathies, sacral and sacrococcygeal region: Secondary | ICD-10-CM | POA: Diagnosis not present

## 2021-01-11 DIAGNOSIS — M9904 Segmental and somatic dysfunction of sacral region: Secondary | ICD-10-CM | POA: Diagnosis not present

## 2021-01-19 DIAGNOSIS — H401132 Primary open-angle glaucoma, bilateral, moderate stage: Secondary | ICD-10-CM | POA: Diagnosis not present

## 2021-01-25 DIAGNOSIS — M9904 Segmental and somatic dysfunction of sacral region: Secondary | ICD-10-CM | POA: Diagnosis not present

## 2021-01-25 DIAGNOSIS — M5388 Other specified dorsopathies, sacral and sacrococcygeal region: Secondary | ICD-10-CM | POA: Diagnosis not present

## 2021-01-25 DIAGNOSIS — M9902 Segmental and somatic dysfunction of thoracic region: Secondary | ICD-10-CM | POA: Diagnosis not present

## 2021-01-25 DIAGNOSIS — M9901 Segmental and somatic dysfunction of cervical region: Secondary | ICD-10-CM | POA: Diagnosis not present

## 2021-01-25 DIAGNOSIS — M47892 Other spondylosis, cervical region: Secondary | ICD-10-CM | POA: Diagnosis not present

## 2021-01-25 DIAGNOSIS — M4723 Other spondylosis with radiculopathy, cervicothoracic region: Secondary | ICD-10-CM | POA: Diagnosis not present

## 2021-02-08 DIAGNOSIS — M5388 Other specified dorsopathies, sacral and sacrococcygeal region: Secondary | ICD-10-CM | POA: Diagnosis not present

## 2021-02-08 DIAGNOSIS — M9901 Segmental and somatic dysfunction of cervical region: Secondary | ICD-10-CM | POA: Diagnosis not present

## 2021-02-08 DIAGNOSIS — M9904 Segmental and somatic dysfunction of sacral region: Secondary | ICD-10-CM | POA: Diagnosis not present

## 2021-02-08 DIAGNOSIS — M4723 Other spondylosis with radiculopathy, cervicothoracic region: Secondary | ICD-10-CM | POA: Diagnosis not present

## 2021-02-08 DIAGNOSIS — M47892 Other spondylosis, cervical region: Secondary | ICD-10-CM | POA: Diagnosis not present

## 2021-02-08 DIAGNOSIS — M9902 Segmental and somatic dysfunction of thoracic region: Secondary | ICD-10-CM | POA: Diagnosis not present

## 2021-02-25 DIAGNOSIS — M9901 Segmental and somatic dysfunction of cervical region: Secondary | ICD-10-CM | POA: Diagnosis not present

## 2021-02-25 DIAGNOSIS — M4723 Other spondylosis with radiculopathy, cervicothoracic region: Secondary | ICD-10-CM | POA: Diagnosis not present

## 2021-02-25 DIAGNOSIS — M9904 Segmental and somatic dysfunction of sacral region: Secondary | ICD-10-CM | POA: Diagnosis not present

## 2021-02-25 DIAGNOSIS — M47814 Spondylosis without myelopathy or radiculopathy, thoracic region: Secondary | ICD-10-CM | POA: Diagnosis not present

## 2021-02-25 DIAGNOSIS — M5388 Other specified dorsopathies, sacral and sacrococcygeal region: Secondary | ICD-10-CM | POA: Diagnosis not present

## 2021-02-25 DIAGNOSIS — M9902 Segmental and somatic dysfunction of thoracic region: Secondary | ICD-10-CM | POA: Diagnosis not present

## 2021-03-15 DIAGNOSIS — Z23 Encounter for immunization: Secondary | ICD-10-CM | POA: Diagnosis not present

## 2021-03-21 DIAGNOSIS — M9902 Segmental and somatic dysfunction of thoracic region: Secondary | ICD-10-CM | POA: Diagnosis not present

## 2021-03-21 DIAGNOSIS — M9904 Segmental and somatic dysfunction of sacral region: Secondary | ICD-10-CM | POA: Diagnosis not present

## 2021-03-21 DIAGNOSIS — M47814 Spondylosis without myelopathy or radiculopathy, thoracic region: Secondary | ICD-10-CM | POA: Diagnosis not present

## 2021-03-21 DIAGNOSIS — M5388 Other specified dorsopathies, sacral and sacrococcygeal region: Secondary | ICD-10-CM | POA: Diagnosis not present

## 2021-03-21 DIAGNOSIS — M9901 Segmental and somatic dysfunction of cervical region: Secondary | ICD-10-CM | POA: Diagnosis not present

## 2021-03-21 DIAGNOSIS — M4723 Other spondylosis with radiculopathy, cervicothoracic region: Secondary | ICD-10-CM | POA: Diagnosis not present

## 2021-03-29 DIAGNOSIS — M4723 Other spondylosis with radiculopathy, cervicothoracic region: Secondary | ICD-10-CM | POA: Diagnosis not present

## 2021-03-29 DIAGNOSIS — M47814 Spondylosis without myelopathy or radiculopathy, thoracic region: Secondary | ICD-10-CM | POA: Diagnosis not present

## 2021-03-29 DIAGNOSIS — M9902 Segmental and somatic dysfunction of thoracic region: Secondary | ICD-10-CM | POA: Diagnosis not present

## 2021-03-29 DIAGNOSIS — M5388 Other specified dorsopathies, sacral and sacrococcygeal region: Secondary | ICD-10-CM | POA: Diagnosis not present

## 2021-03-29 DIAGNOSIS — M9901 Segmental and somatic dysfunction of cervical region: Secondary | ICD-10-CM | POA: Diagnosis not present

## 2021-03-29 DIAGNOSIS — M9904 Segmental and somatic dysfunction of sacral region: Secondary | ICD-10-CM | POA: Diagnosis not present

## 2021-04-13 DIAGNOSIS — N390 Urinary tract infection, site not specified: Secondary | ICD-10-CM | POA: Diagnosis not present

## 2021-04-25 DIAGNOSIS — M9901 Segmental and somatic dysfunction of cervical region: Secondary | ICD-10-CM | POA: Diagnosis not present

## 2021-04-25 DIAGNOSIS — M9902 Segmental and somatic dysfunction of thoracic region: Secondary | ICD-10-CM | POA: Diagnosis not present

## 2021-04-25 DIAGNOSIS — M5388 Other specified dorsopathies, sacral and sacrococcygeal region: Secondary | ICD-10-CM | POA: Diagnosis not present

## 2021-04-25 DIAGNOSIS — M4303 Spondylolysis, cervicothoracic region: Secondary | ICD-10-CM | POA: Diagnosis not present

## 2021-04-25 DIAGNOSIS — M4304 Spondylolysis, thoracic region: Secondary | ICD-10-CM | POA: Diagnosis not present

## 2021-04-25 DIAGNOSIS — M9904 Segmental and somatic dysfunction of sacral region: Secondary | ICD-10-CM | POA: Diagnosis not present

## 2021-04-27 DIAGNOSIS — H401132 Primary open-angle glaucoma, bilateral, moderate stage: Secondary | ICD-10-CM | POA: Diagnosis not present

## 2021-05-17 DIAGNOSIS — D225 Melanocytic nevi of trunk: Secondary | ICD-10-CM | POA: Diagnosis not present

## 2021-05-17 DIAGNOSIS — L814 Other melanin hyperpigmentation: Secondary | ICD-10-CM | POA: Diagnosis not present

## 2021-05-17 DIAGNOSIS — L578 Other skin changes due to chronic exposure to nonionizing radiation: Secondary | ICD-10-CM | POA: Diagnosis not present

## 2021-05-17 DIAGNOSIS — L570 Actinic keratosis: Secondary | ICD-10-CM | POA: Diagnosis not present

## 2021-05-17 DIAGNOSIS — L821 Other seborrheic keratosis: Secondary | ICD-10-CM | POA: Diagnosis not present

## 2021-05-24 DIAGNOSIS — I25119 Atherosclerotic heart disease of native coronary artery with unspecified angina pectoris: Secondary | ICD-10-CM | POA: Diagnosis not present

## 2021-05-24 DIAGNOSIS — M5388 Other specified dorsopathies, sacral and sacrococcygeal region: Secondary | ICD-10-CM | POA: Diagnosis not present

## 2021-05-24 DIAGNOSIS — I5032 Chronic diastolic (congestive) heart failure: Secondary | ICD-10-CM | POA: Diagnosis not present

## 2021-05-24 DIAGNOSIS — I110 Hypertensive heart disease with heart failure: Secondary | ICD-10-CM | POA: Diagnosis not present

## 2021-05-24 DIAGNOSIS — M9904 Segmental and somatic dysfunction of sacral region: Secondary | ICD-10-CM | POA: Diagnosis not present

## 2021-05-24 DIAGNOSIS — I700 Atherosclerosis of aorta: Secondary | ICD-10-CM | POA: Diagnosis not present

## 2021-05-24 DIAGNOSIS — E539 Vitamin B deficiency, unspecified: Secondary | ICD-10-CM | POA: Diagnosis not present

## 2021-05-24 DIAGNOSIS — G4700 Insomnia, unspecified: Secondary | ICD-10-CM | POA: Diagnosis not present

## 2021-05-24 DIAGNOSIS — R7301 Impaired fasting glucose: Secondary | ICD-10-CM | POA: Diagnosis not present

## 2021-05-24 DIAGNOSIS — M9901 Segmental and somatic dysfunction of cervical region: Secondary | ICD-10-CM | POA: Diagnosis not present

## 2021-05-24 DIAGNOSIS — M4303 Spondylolysis, cervicothoracic region: Secondary | ICD-10-CM | POA: Diagnosis not present

## 2021-05-24 DIAGNOSIS — K2960 Other gastritis without bleeding: Secondary | ICD-10-CM | POA: Diagnosis not present

## 2021-05-24 DIAGNOSIS — G720 Drug-induced myopathy: Secondary | ICD-10-CM | POA: Diagnosis not present

## 2021-05-24 DIAGNOSIS — M1991 Primary osteoarthritis, unspecified site: Secondary | ICD-10-CM | POA: Diagnosis not present

## 2021-05-24 DIAGNOSIS — E785 Hyperlipidemia, unspecified: Secondary | ICD-10-CM | POA: Diagnosis not present

## 2021-05-24 DIAGNOSIS — M4304 Spondylolysis, thoracic region: Secondary | ICD-10-CM | POA: Diagnosis not present

## 2021-05-24 DIAGNOSIS — M9902 Segmental and somatic dysfunction of thoracic region: Secondary | ICD-10-CM | POA: Diagnosis not present

## 2021-05-24 DIAGNOSIS — Z0000 Encounter for general adult medical examination without abnormal findings: Secondary | ICD-10-CM | POA: Diagnosis not present

## 2021-06-15 DIAGNOSIS — H401123 Primary open-angle glaucoma, left eye, severe stage: Secondary | ICD-10-CM | POA: Diagnosis not present

## 2021-06-29 DIAGNOSIS — N390 Urinary tract infection, site not specified: Secondary | ICD-10-CM | POA: Diagnosis not present

## 2021-06-29 DIAGNOSIS — R339 Retention of urine, unspecified: Secondary | ICD-10-CM | POA: Diagnosis not present

## 2021-07-05 DIAGNOSIS — M9904 Segmental and somatic dysfunction of sacral region: Secondary | ICD-10-CM | POA: Diagnosis not present

## 2021-07-05 DIAGNOSIS — M5388 Other specified dorsopathies, sacral and sacrococcygeal region: Secondary | ICD-10-CM | POA: Diagnosis not present

## 2021-07-05 DIAGNOSIS — M47814 Spondylosis without myelopathy or radiculopathy, thoracic region: Secondary | ICD-10-CM | POA: Diagnosis not present

## 2021-07-05 DIAGNOSIS — M47813 Spondylosis without myelopathy or radiculopathy, cervicothoracic region: Secondary | ICD-10-CM | POA: Diagnosis not present

## 2021-07-05 DIAGNOSIS — M9901 Segmental and somatic dysfunction of cervical region: Secondary | ICD-10-CM | POA: Diagnosis not present

## 2021-07-05 DIAGNOSIS — M9902 Segmental and somatic dysfunction of thoracic region: Secondary | ICD-10-CM | POA: Diagnosis not present

## 2021-07-26 DIAGNOSIS — M47813 Spondylosis without myelopathy or radiculopathy, cervicothoracic region: Secondary | ICD-10-CM | POA: Diagnosis not present

## 2021-07-26 DIAGNOSIS — M9901 Segmental and somatic dysfunction of cervical region: Secondary | ICD-10-CM | POA: Diagnosis not present

## 2021-07-26 DIAGNOSIS — M5388 Other specified dorsopathies, sacral and sacrococcygeal region: Secondary | ICD-10-CM | POA: Diagnosis not present

## 2021-07-26 DIAGNOSIS — M9902 Segmental and somatic dysfunction of thoracic region: Secondary | ICD-10-CM | POA: Diagnosis not present

## 2021-07-26 DIAGNOSIS — M47814 Spondylosis without myelopathy or radiculopathy, thoracic region: Secondary | ICD-10-CM | POA: Diagnosis not present

## 2021-07-26 DIAGNOSIS — M9904 Segmental and somatic dysfunction of sacral region: Secondary | ICD-10-CM | POA: Diagnosis not present

## 2021-08-17 DIAGNOSIS — H402232 Chronic angle-closure glaucoma, bilateral, moderate stage: Secondary | ICD-10-CM | POA: Diagnosis not present

## 2021-08-17 DIAGNOSIS — Z961 Presence of intraocular lens: Secondary | ICD-10-CM | POA: Diagnosis not present

## 2021-08-23 DIAGNOSIS — M4723 Other spondylosis with radiculopathy, cervicothoracic region: Secondary | ICD-10-CM | POA: Diagnosis not present

## 2021-08-23 DIAGNOSIS — M62838 Other muscle spasm: Secondary | ICD-10-CM | POA: Diagnosis not present

## 2021-08-23 DIAGNOSIS — M47897 Other spondylosis, lumbosacral region: Secondary | ICD-10-CM | POA: Diagnosis not present

## 2021-08-23 DIAGNOSIS — M9903 Segmental and somatic dysfunction of lumbar region: Secondary | ICD-10-CM | POA: Diagnosis not present

## 2021-08-23 DIAGNOSIS — M9901 Segmental and somatic dysfunction of cervical region: Secondary | ICD-10-CM | POA: Diagnosis not present

## 2021-08-23 DIAGNOSIS — M47894 Other spondylosis, thoracic region: Secondary | ICD-10-CM | POA: Diagnosis not present

## 2021-08-23 DIAGNOSIS — M9902 Segmental and somatic dysfunction of thoracic region: Secondary | ICD-10-CM | POA: Diagnosis not present

## 2021-09-07 DIAGNOSIS — M9904 Segmental and somatic dysfunction of sacral region: Secondary | ICD-10-CM | POA: Diagnosis not present

## 2021-09-07 DIAGNOSIS — M9902 Segmental and somatic dysfunction of thoracic region: Secondary | ICD-10-CM | POA: Diagnosis not present

## 2021-09-07 DIAGNOSIS — M4724 Other spondylosis with radiculopathy, thoracic region: Secondary | ICD-10-CM | POA: Diagnosis not present

## 2021-09-07 DIAGNOSIS — M9903 Segmental and somatic dysfunction of lumbar region: Secondary | ICD-10-CM | POA: Diagnosis not present

## 2021-09-07 DIAGNOSIS — M47897 Other spondylosis, lumbosacral region: Secondary | ICD-10-CM | POA: Diagnosis not present

## 2021-09-07 DIAGNOSIS — M5388 Other specified dorsopathies, sacral and sacrococcygeal region: Secondary | ICD-10-CM | POA: Diagnosis not present

## 2021-09-15 DIAGNOSIS — M546 Pain in thoracic spine: Secondary | ICD-10-CM | POA: Diagnosis not present

## 2021-09-15 DIAGNOSIS — M9902 Segmental and somatic dysfunction of thoracic region: Secondary | ICD-10-CM | POA: Diagnosis not present

## 2021-09-23 DIAGNOSIS — K219 Gastro-esophageal reflux disease without esophagitis: Secondary | ICD-10-CM | POA: Diagnosis not present

## 2021-09-23 DIAGNOSIS — J479 Bronchiectasis, uncomplicated: Secondary | ICD-10-CM | POA: Diagnosis not present

## 2021-09-23 DIAGNOSIS — I10 Essential (primary) hypertension: Secondary | ICD-10-CM | POA: Diagnosis not present

## 2021-09-23 DIAGNOSIS — U099 Post covid-19 condition, unspecified: Secondary | ICD-10-CM | POA: Diagnosis not present

## 2021-09-23 DIAGNOSIS — I700 Atherosclerosis of aorta: Secondary | ICD-10-CM | POA: Diagnosis not present

## 2021-09-23 DIAGNOSIS — Z8701 Personal history of pneumonia (recurrent): Secondary | ICD-10-CM | POA: Diagnosis not present

## 2021-09-27 DIAGNOSIS — M4723 Other spondylosis with radiculopathy, cervicothoracic region: Secondary | ICD-10-CM | POA: Diagnosis not present

## 2021-09-27 DIAGNOSIS — M4724 Other spondylosis with radiculopathy, thoracic region: Secondary | ICD-10-CM | POA: Diagnosis not present

## 2021-09-27 DIAGNOSIS — M9904 Segmental and somatic dysfunction of sacral region: Secondary | ICD-10-CM | POA: Diagnosis not present

## 2021-09-27 DIAGNOSIS — M9901 Segmental and somatic dysfunction of cervical region: Secondary | ICD-10-CM | POA: Diagnosis not present

## 2021-09-27 DIAGNOSIS — M9902 Segmental and somatic dysfunction of thoracic region: Secondary | ICD-10-CM | POA: Diagnosis not present

## 2021-09-27 DIAGNOSIS — M5388 Other specified dorsopathies, sacral and sacrococcygeal region: Secondary | ICD-10-CM | POA: Diagnosis not present

## 2021-10-04 DIAGNOSIS — Z1231 Encounter for screening mammogram for malignant neoplasm of breast: Secondary | ICD-10-CM | POA: Diagnosis not present

## 2021-10-18 DIAGNOSIS — M4724 Other spondylosis with radiculopathy, thoracic region: Secondary | ICD-10-CM | POA: Diagnosis not present

## 2021-10-18 DIAGNOSIS — M5388 Other specified dorsopathies, sacral and sacrococcygeal region: Secondary | ICD-10-CM | POA: Diagnosis not present

## 2021-10-18 DIAGNOSIS — M9901 Segmental and somatic dysfunction of cervical region: Secondary | ICD-10-CM | POA: Diagnosis not present

## 2021-10-18 DIAGNOSIS — M9902 Segmental and somatic dysfunction of thoracic region: Secondary | ICD-10-CM | POA: Diagnosis not present

## 2021-10-18 DIAGNOSIS — M4723 Other spondylosis with radiculopathy, cervicothoracic region: Secondary | ICD-10-CM | POA: Diagnosis not present

## 2021-10-18 DIAGNOSIS — M9904 Segmental and somatic dysfunction of sacral region: Secondary | ICD-10-CM | POA: Diagnosis not present

## 2021-11-25 DIAGNOSIS — M5388 Other specified dorsopathies, sacral and sacrococcygeal region: Secondary | ICD-10-CM | POA: Diagnosis not present

## 2021-11-25 DIAGNOSIS — M9901 Segmental and somatic dysfunction of cervical region: Secondary | ICD-10-CM | POA: Diagnosis not present

## 2021-11-25 DIAGNOSIS — M9904 Segmental and somatic dysfunction of sacral region: Secondary | ICD-10-CM | POA: Diagnosis not present

## 2021-11-25 DIAGNOSIS — M4722 Other spondylosis with radiculopathy, cervical region: Secondary | ICD-10-CM | POA: Diagnosis not present

## 2021-11-25 DIAGNOSIS — M9902 Segmental and somatic dysfunction of thoracic region: Secondary | ICD-10-CM | POA: Diagnosis not present

## 2021-11-25 DIAGNOSIS — M4724 Other spondylosis with radiculopathy, thoracic region: Secondary | ICD-10-CM | POA: Diagnosis not present

## 2021-12-06 DIAGNOSIS — M4722 Other spondylosis with radiculopathy, cervical region: Secondary | ICD-10-CM | POA: Diagnosis not present

## 2021-12-06 DIAGNOSIS — M9901 Segmental and somatic dysfunction of cervical region: Secondary | ICD-10-CM | POA: Diagnosis not present

## 2021-12-06 DIAGNOSIS — M9902 Segmental and somatic dysfunction of thoracic region: Secondary | ICD-10-CM | POA: Diagnosis not present

## 2021-12-06 DIAGNOSIS — M5388 Other specified dorsopathies, sacral and sacrococcygeal region: Secondary | ICD-10-CM | POA: Diagnosis not present

## 2021-12-06 DIAGNOSIS — M4724 Other spondylosis with radiculopathy, thoracic region: Secondary | ICD-10-CM | POA: Diagnosis not present

## 2021-12-06 DIAGNOSIS — M9904 Segmental and somatic dysfunction of sacral region: Secondary | ICD-10-CM | POA: Diagnosis not present

## 2021-12-28 DIAGNOSIS — H402232 Chronic angle-closure glaucoma, bilateral, moderate stage: Secondary | ICD-10-CM | POA: Diagnosis not present

## 2021-12-28 DIAGNOSIS — Z961 Presence of intraocular lens: Secondary | ICD-10-CM | POA: Diagnosis not present

## 2022-01-02 DIAGNOSIS — M9902 Segmental and somatic dysfunction of thoracic region: Secondary | ICD-10-CM | POA: Diagnosis not present

## 2022-01-02 DIAGNOSIS — M5388 Other specified dorsopathies, sacral and sacrococcygeal region: Secondary | ICD-10-CM | POA: Diagnosis not present

## 2022-01-02 DIAGNOSIS — M4723 Other spondylosis with radiculopathy, cervicothoracic region: Secondary | ICD-10-CM | POA: Diagnosis not present

## 2022-01-02 DIAGNOSIS — M4724 Other spondylosis with radiculopathy, thoracic region: Secondary | ICD-10-CM | POA: Diagnosis not present

## 2022-01-02 DIAGNOSIS — M9904 Segmental and somatic dysfunction of sacral region: Secondary | ICD-10-CM | POA: Diagnosis not present

## 2022-01-02 DIAGNOSIS — M9901 Segmental and somatic dysfunction of cervical region: Secondary | ICD-10-CM | POA: Diagnosis not present

## 2022-02-06 DIAGNOSIS — M4724 Other spondylosis with radiculopathy, thoracic region: Secondary | ICD-10-CM | POA: Diagnosis not present

## 2022-02-06 DIAGNOSIS — M9901 Segmental and somatic dysfunction of cervical region: Secondary | ICD-10-CM | POA: Diagnosis not present

## 2022-02-06 DIAGNOSIS — M4723 Other spondylosis with radiculopathy, cervicothoracic region: Secondary | ICD-10-CM | POA: Diagnosis not present

## 2022-02-06 DIAGNOSIS — M5388 Other specified dorsopathies, sacral and sacrococcygeal region: Secondary | ICD-10-CM | POA: Diagnosis not present

## 2022-02-06 DIAGNOSIS — M9902 Segmental and somatic dysfunction of thoracic region: Secondary | ICD-10-CM | POA: Diagnosis not present

## 2022-02-06 DIAGNOSIS — M9904 Segmental and somatic dysfunction of sacral region: Secondary | ICD-10-CM | POA: Diagnosis not present

## 2022-02-20 DIAGNOSIS — M9903 Segmental and somatic dysfunction of lumbar region: Secondary | ICD-10-CM | POA: Diagnosis not present

## 2022-02-20 DIAGNOSIS — M47814 Spondylosis without myelopathy or radiculopathy, thoracic region: Secondary | ICD-10-CM | POA: Diagnosis not present

## 2022-02-20 DIAGNOSIS — M47812 Spondylosis without myelopathy or radiculopathy, cervical region: Secondary | ICD-10-CM | POA: Diagnosis not present

## 2022-02-20 DIAGNOSIS — M47896 Other spondylosis, lumbar region: Secondary | ICD-10-CM | POA: Diagnosis not present

## 2022-02-20 DIAGNOSIS — M9902 Segmental and somatic dysfunction of thoracic region: Secondary | ICD-10-CM | POA: Diagnosis not present

## 2022-02-20 DIAGNOSIS — M9901 Segmental and somatic dysfunction of cervical region: Secondary | ICD-10-CM | POA: Diagnosis not present

## 2022-03-11 DIAGNOSIS — E785 Hyperlipidemia, unspecified: Secondary | ICD-10-CM | POA: Diagnosis not present

## 2022-03-11 DIAGNOSIS — I110 Hypertensive heart disease with heart failure: Secondary | ICD-10-CM | POA: Diagnosis not present

## 2022-03-11 DIAGNOSIS — I700 Atherosclerosis of aorta: Secondary | ICD-10-CM | POA: Diagnosis not present

## 2022-03-21 DIAGNOSIS — I10 Essential (primary) hypertension: Secondary | ICD-10-CM | POA: Diagnosis not present

## 2022-04-07 DIAGNOSIS — H402232 Chronic angle-closure glaucoma, bilateral, moderate stage: Secondary | ICD-10-CM | POA: Diagnosis not present

## 2022-04-07 DIAGNOSIS — Z23 Encounter for immunization: Secondary | ICD-10-CM | POA: Diagnosis not present

## 2022-04-07 DIAGNOSIS — H1032 Unspecified acute conjunctivitis, left eye: Secondary | ICD-10-CM | POA: Diagnosis not present

## 2022-04-28 DIAGNOSIS — M4726 Other spondylosis with radiculopathy, lumbar region: Secondary | ICD-10-CM | POA: Diagnosis not present

## 2022-04-28 DIAGNOSIS — M9904 Segmental and somatic dysfunction of sacral region: Secondary | ICD-10-CM | POA: Diagnosis not present

## 2022-04-28 DIAGNOSIS — M5388 Other specified dorsopathies, sacral and sacrococcygeal region: Secondary | ICD-10-CM | POA: Diagnosis not present

## 2022-04-28 DIAGNOSIS — M9903 Segmental and somatic dysfunction of lumbar region: Secondary | ICD-10-CM | POA: Diagnosis not present

## 2022-04-28 DIAGNOSIS — M47814 Spondylosis without myelopathy or radiculopathy, thoracic region: Secondary | ICD-10-CM | POA: Diagnosis not present

## 2022-04-28 DIAGNOSIS — M9902 Segmental and somatic dysfunction of thoracic region: Secondary | ICD-10-CM | POA: Diagnosis not present

## 2022-05-11 DIAGNOSIS — I10 Essential (primary) hypertension: Secondary | ICD-10-CM | POA: Diagnosis not present

## 2022-05-17 DIAGNOSIS — H1032 Unspecified acute conjunctivitis, left eye: Secondary | ICD-10-CM | POA: Diagnosis not present

## 2022-05-17 DIAGNOSIS — H402232 Chronic angle-closure glaucoma, bilateral, moderate stage: Secondary | ICD-10-CM | POA: Diagnosis not present

## 2022-05-23 DIAGNOSIS — M9902 Segmental and somatic dysfunction of thoracic region: Secondary | ICD-10-CM | POA: Diagnosis not present

## 2022-05-23 DIAGNOSIS — M47893 Other spondylosis, cervicothoracic region: Secondary | ICD-10-CM | POA: Diagnosis not present

## 2022-05-23 DIAGNOSIS — M9904 Segmental and somatic dysfunction of sacral region: Secondary | ICD-10-CM | POA: Diagnosis not present

## 2022-05-23 DIAGNOSIS — M5388 Other specified dorsopathies, sacral and sacrococcygeal region: Secondary | ICD-10-CM | POA: Diagnosis not present

## 2022-05-23 DIAGNOSIS — M9901 Segmental and somatic dysfunction of cervical region: Secondary | ICD-10-CM | POA: Diagnosis not present

## 2022-05-23 DIAGNOSIS — M47812 Spondylosis without myelopathy or radiculopathy, cervical region: Secondary | ICD-10-CM | POA: Diagnosis not present

## 2022-05-30 DIAGNOSIS — I700 Atherosclerosis of aorta: Secondary | ICD-10-CM | POA: Diagnosis not present

## 2022-05-30 DIAGNOSIS — K2960 Other gastritis without bleeding: Secondary | ICD-10-CM | POA: Diagnosis not present

## 2022-05-30 DIAGNOSIS — I110 Hypertensive heart disease with heart failure: Secondary | ICD-10-CM | POA: Diagnosis not present

## 2022-05-30 DIAGNOSIS — K582 Mixed irritable bowel syndrome: Secondary | ICD-10-CM | POA: Diagnosis not present

## 2022-05-30 DIAGNOSIS — G720 Drug-induced myopathy: Secondary | ICD-10-CM | POA: Diagnosis not present

## 2022-05-30 DIAGNOSIS — E785 Hyperlipidemia, unspecified: Secondary | ICD-10-CM | POA: Diagnosis not present

## 2022-05-30 DIAGNOSIS — M8580 Other specified disorders of bone density and structure, unspecified site: Secondary | ICD-10-CM | POA: Diagnosis not present

## 2022-05-30 DIAGNOSIS — R7301 Impaired fasting glucose: Secondary | ICD-10-CM | POA: Diagnosis not present

## 2022-05-30 DIAGNOSIS — I5032 Chronic diastolic (congestive) heart failure: Secondary | ICD-10-CM | POA: Diagnosis not present

## 2022-05-30 DIAGNOSIS — M1991 Primary osteoarthritis, unspecified site: Secondary | ICD-10-CM | POA: Diagnosis not present

## 2022-05-30 DIAGNOSIS — I25119 Atherosclerotic heart disease of native coronary artery with unspecified angina pectoris: Secondary | ICD-10-CM | POA: Diagnosis not present

## 2022-05-30 DIAGNOSIS — Z0000 Encounter for general adult medical examination without abnormal findings: Secondary | ICD-10-CM | POA: Diagnosis not present

## 2022-06-19 DIAGNOSIS — J40 Bronchitis, not specified as acute or chronic: Secondary | ICD-10-CM | POA: Diagnosis not present

## 2022-07-11 DIAGNOSIS — M9904 Segmental and somatic dysfunction of sacral region: Secondary | ICD-10-CM | POA: Diagnosis not present

## 2022-07-11 DIAGNOSIS — M9901 Segmental and somatic dysfunction of cervical region: Secondary | ICD-10-CM | POA: Diagnosis not present

## 2022-07-11 DIAGNOSIS — M47812 Spondylosis without myelopathy or radiculopathy, cervical region: Secondary | ICD-10-CM | POA: Diagnosis not present

## 2022-07-11 DIAGNOSIS — Z6822 Body mass index (BMI) 22.0-22.9, adult: Secondary | ICD-10-CM | POA: Diagnosis not present

## 2022-07-11 DIAGNOSIS — H60542 Acute eczematoid otitis externa, left ear: Secondary | ICD-10-CM | POA: Diagnosis not present

## 2022-07-11 DIAGNOSIS — M5388 Other specified dorsopathies, sacral and sacrococcygeal region: Secondary | ICD-10-CM | POA: Diagnosis not present

## 2022-07-11 DIAGNOSIS — M47893 Other spondylosis, cervicothoracic region: Secondary | ICD-10-CM | POA: Diagnosis not present

## 2022-07-11 DIAGNOSIS — M9902 Segmental and somatic dysfunction of thoracic region: Secondary | ICD-10-CM | POA: Diagnosis not present

## 2022-07-11 DIAGNOSIS — H6123 Impacted cerumen, bilateral: Secondary | ICD-10-CM | POA: Diagnosis not present

## 2022-07-11 DIAGNOSIS — L209 Atopic dermatitis, unspecified: Secondary | ICD-10-CM | POA: Diagnosis not present

## 2022-07-27 DIAGNOSIS — I110 Hypertensive heart disease with heart failure: Secondary | ICD-10-CM | POA: Diagnosis not present

## 2022-07-27 DIAGNOSIS — I700 Atherosclerosis of aorta: Secondary | ICD-10-CM | POA: Diagnosis not present

## 2022-07-27 DIAGNOSIS — Z6822 Body mass index (BMI) 22.0-22.9, adult: Secondary | ICD-10-CM | POA: Diagnosis not present

## 2022-07-27 DIAGNOSIS — I25119 Atherosclerotic heart disease of native coronary artery with unspecified angina pectoris: Secondary | ICD-10-CM | POA: Diagnosis not present

## 2022-07-27 DIAGNOSIS — I5032 Chronic diastolic (congestive) heart failure: Secondary | ICD-10-CM | POA: Diagnosis not present

## 2022-07-27 DIAGNOSIS — H60393 Other infective otitis externa, bilateral: Secondary | ICD-10-CM | POA: Diagnosis not present

## 2022-08-15 DIAGNOSIS — M9901 Segmental and somatic dysfunction of cervical region: Secondary | ICD-10-CM | POA: Diagnosis not present

## 2022-08-15 DIAGNOSIS — M47812 Spondylosis without myelopathy or radiculopathy, cervical region: Secondary | ICD-10-CM | POA: Diagnosis not present

## 2022-08-15 DIAGNOSIS — M9904 Segmental and somatic dysfunction of sacral region: Secondary | ICD-10-CM | POA: Diagnosis not present

## 2022-08-15 DIAGNOSIS — M4303 Spondylolysis, cervicothoracic region: Secondary | ICD-10-CM | POA: Diagnosis not present

## 2022-08-15 DIAGNOSIS — M5388 Other specified dorsopathies, sacral and sacrococcygeal region: Secondary | ICD-10-CM | POA: Diagnosis not present

## 2022-08-15 DIAGNOSIS — M9902 Segmental and somatic dysfunction of thoracic region: Secondary | ICD-10-CM | POA: Diagnosis not present

## 2022-08-16 DIAGNOSIS — H402232 Chronic angle-closure glaucoma, bilateral, moderate stage: Secondary | ICD-10-CM | POA: Diagnosis not present

## 2022-09-05 DIAGNOSIS — M9901 Segmental and somatic dysfunction of cervical region: Secondary | ICD-10-CM | POA: Diagnosis not present

## 2022-09-05 DIAGNOSIS — M9902 Segmental and somatic dysfunction of thoracic region: Secondary | ICD-10-CM | POA: Diagnosis not present

## 2022-09-05 DIAGNOSIS — M9903 Segmental and somatic dysfunction of lumbar region: Secondary | ICD-10-CM | POA: Diagnosis not present

## 2022-09-05 DIAGNOSIS — M47816 Spondylosis without myelopathy or radiculopathy, lumbar region: Secondary | ICD-10-CM | POA: Diagnosis not present

## 2022-09-05 DIAGNOSIS — M47814 Spondylosis without myelopathy or radiculopathy, thoracic region: Secondary | ICD-10-CM | POA: Diagnosis not present

## 2022-09-05 DIAGNOSIS — M4302 Spondylolysis, cervical region: Secondary | ICD-10-CM | POA: Diagnosis not present

## 2022-09-26 DIAGNOSIS — M9902 Segmental and somatic dysfunction of thoracic region: Secondary | ICD-10-CM | POA: Diagnosis not present

## 2022-09-26 DIAGNOSIS — M4304 Spondylolysis, thoracic region: Secondary | ICD-10-CM | POA: Diagnosis not present

## 2022-09-26 DIAGNOSIS — M4303 Spondylolysis, cervicothoracic region: Secondary | ICD-10-CM | POA: Diagnosis not present

## 2022-09-26 DIAGNOSIS — M9901 Segmental and somatic dysfunction of cervical region: Secondary | ICD-10-CM | POA: Diagnosis not present

## 2022-09-26 DIAGNOSIS — M9904 Segmental and somatic dysfunction of sacral region: Secondary | ICD-10-CM | POA: Diagnosis not present

## 2022-09-26 DIAGNOSIS — M5388 Other specified dorsopathies, sacral and sacrococcygeal region: Secondary | ICD-10-CM | POA: Diagnosis not present

## 2022-10-23 DIAGNOSIS — M4303 Spondylolysis, cervicothoracic region: Secondary | ICD-10-CM | POA: Diagnosis not present

## 2022-10-23 DIAGNOSIS — M5388 Other specified dorsopathies, sacral and sacrococcygeal region: Secondary | ICD-10-CM | POA: Diagnosis not present

## 2022-10-23 DIAGNOSIS — M4304 Spondylolysis, thoracic region: Secondary | ICD-10-CM | POA: Diagnosis not present

## 2022-10-23 DIAGNOSIS — M9904 Segmental and somatic dysfunction of sacral region: Secondary | ICD-10-CM | POA: Diagnosis not present

## 2022-10-23 DIAGNOSIS — M9902 Segmental and somatic dysfunction of thoracic region: Secondary | ICD-10-CM | POA: Diagnosis not present

## 2022-10-23 DIAGNOSIS — M9901 Segmental and somatic dysfunction of cervical region: Secondary | ICD-10-CM | POA: Diagnosis not present

## 2022-11-21 DIAGNOSIS — M5388 Other specified dorsopathies, sacral and sacrococcygeal region: Secondary | ICD-10-CM | POA: Diagnosis not present

## 2022-11-21 DIAGNOSIS — M47893 Other spondylosis, cervicothoracic region: Secondary | ICD-10-CM | POA: Diagnosis not present

## 2022-11-21 DIAGNOSIS — M9902 Segmental and somatic dysfunction of thoracic region: Secondary | ICD-10-CM | POA: Diagnosis not present

## 2022-11-21 DIAGNOSIS — M9901 Segmental and somatic dysfunction of cervical region: Secondary | ICD-10-CM | POA: Diagnosis not present

## 2022-11-21 DIAGNOSIS — M4724 Other spondylosis with radiculopathy, thoracic region: Secondary | ICD-10-CM | POA: Diagnosis not present

## 2022-11-21 DIAGNOSIS — M9904 Segmental and somatic dysfunction of sacral region: Secondary | ICD-10-CM | POA: Diagnosis not present

## 2022-11-29 DIAGNOSIS — H402232 Chronic angle-closure glaucoma, bilateral, moderate stage: Secondary | ICD-10-CM | POA: Diagnosis not present

## 2022-12-04 DIAGNOSIS — I700 Atherosclerosis of aorta: Secondary | ICD-10-CM | POA: Diagnosis not present

## 2022-12-04 DIAGNOSIS — K219 Gastro-esophageal reflux disease without esophagitis: Secondary | ICD-10-CM | POA: Diagnosis not present

## 2022-12-04 DIAGNOSIS — I10 Essential (primary) hypertension: Secondary | ICD-10-CM | POA: Diagnosis not present

## 2022-12-10 DIAGNOSIS — K2960 Other gastritis without bleeding: Secondary | ICD-10-CM | POA: Diagnosis not present

## 2022-12-10 DIAGNOSIS — I5032 Chronic diastolic (congestive) heart failure: Secondary | ICD-10-CM | POA: Diagnosis not present

## 2022-12-10 DIAGNOSIS — E785 Hyperlipidemia, unspecified: Secondary | ICD-10-CM | POA: Diagnosis not present

## 2022-12-10 DIAGNOSIS — I110 Hypertensive heart disease with heart failure: Secondary | ICD-10-CM | POA: Diagnosis not present

## 2023-01-02 DIAGNOSIS — M9902 Segmental and somatic dysfunction of thoracic region: Secondary | ICD-10-CM | POA: Diagnosis not present

## 2023-01-02 DIAGNOSIS — M5388 Other specified dorsopathies, sacral and sacrococcygeal region: Secondary | ICD-10-CM | POA: Diagnosis not present

## 2023-01-02 DIAGNOSIS — M47814 Spondylosis without myelopathy or radiculopathy, thoracic region: Secondary | ICD-10-CM | POA: Diagnosis not present

## 2023-01-02 DIAGNOSIS — M9901 Segmental and somatic dysfunction of cervical region: Secondary | ICD-10-CM | POA: Diagnosis not present

## 2023-01-02 DIAGNOSIS — M47812 Spondylosis without myelopathy or radiculopathy, cervical region: Secondary | ICD-10-CM | POA: Diagnosis not present

## 2023-01-02 DIAGNOSIS — M9904 Segmental and somatic dysfunction of sacral region: Secondary | ICD-10-CM | POA: Diagnosis not present

## 2023-01-19 DIAGNOSIS — I5032 Chronic diastolic (congestive) heart failure: Secondary | ICD-10-CM | POA: Diagnosis not present

## 2023-01-19 DIAGNOSIS — J0140 Acute pansinusitis, unspecified: Secondary | ICD-10-CM | POA: Diagnosis not present

## 2023-01-19 DIAGNOSIS — I110 Hypertensive heart disease with heart failure: Secondary | ICD-10-CM | POA: Diagnosis not present

## 2023-01-19 DIAGNOSIS — Z6821 Body mass index (BMI) 21.0-21.9, adult: Secondary | ICD-10-CM | POA: Diagnosis not present

## 2023-01-19 DIAGNOSIS — I25119 Atherosclerotic heart disease of native coronary artery with unspecified angina pectoris: Secondary | ICD-10-CM | POA: Diagnosis not present

## 2023-01-19 DIAGNOSIS — G4700 Insomnia, unspecified: Secondary | ICD-10-CM | POA: Diagnosis not present

## 2023-01-19 DIAGNOSIS — I700 Atherosclerosis of aorta: Secondary | ICD-10-CM | POA: Diagnosis not present

## 2023-01-19 DIAGNOSIS — F4321 Adjustment disorder with depressed mood: Secondary | ICD-10-CM | POA: Diagnosis not present

## 2023-01-30 DIAGNOSIS — M9903 Segmental and somatic dysfunction of lumbar region: Secondary | ICD-10-CM | POA: Diagnosis not present

## 2023-01-30 DIAGNOSIS — M47897 Other spondylosis, lumbosacral region: Secondary | ICD-10-CM | POA: Diagnosis not present

## 2023-01-30 DIAGNOSIS — M9902 Segmental and somatic dysfunction of thoracic region: Secondary | ICD-10-CM | POA: Diagnosis not present

## 2023-01-30 DIAGNOSIS — M47814 Spondylosis without myelopathy or radiculopathy, thoracic region: Secondary | ICD-10-CM | POA: Diagnosis not present

## 2023-01-30 DIAGNOSIS — M47812 Spondylosis without myelopathy or radiculopathy, cervical region: Secondary | ICD-10-CM | POA: Diagnosis not present

## 2023-01-30 DIAGNOSIS — M9901 Segmental and somatic dysfunction of cervical region: Secondary | ICD-10-CM | POA: Diagnosis not present

## 2023-01-31 DIAGNOSIS — H02831 Dermatochalasis of right upper eyelid: Secondary | ICD-10-CM | POA: Diagnosis not present

## 2023-01-31 DIAGNOSIS — H02834 Dermatochalasis of left upper eyelid: Secondary | ICD-10-CM | POA: Diagnosis not present

## 2023-03-13 DIAGNOSIS — H02834 Dermatochalasis of left upper eyelid: Secondary | ICD-10-CM | POA: Diagnosis not present

## 2023-03-13 DIAGNOSIS — H02831 Dermatochalasis of right upper eyelid: Secondary | ICD-10-CM | POA: Diagnosis not present

## 2023-03-13 DIAGNOSIS — Z01818 Encounter for other preprocedural examination: Secondary | ICD-10-CM | POA: Diagnosis not present

## 2023-03-13 DIAGNOSIS — H53453 Other localized visual field defect, bilateral: Secondary | ICD-10-CM | POA: Diagnosis not present

## 2023-04-03 DIAGNOSIS — Z1231 Encounter for screening mammogram for malignant neoplasm of breast: Secondary | ICD-10-CM | POA: Diagnosis not present

## 2023-04-04 DIAGNOSIS — H402232 Chronic angle-closure glaucoma, bilateral, moderate stage: Secondary | ICD-10-CM | POA: Diagnosis not present

## 2023-04-09 DIAGNOSIS — R339 Retention of urine, unspecified: Secondary | ICD-10-CM | POA: Diagnosis not present

## 2023-04-09 DIAGNOSIS — N390 Urinary tract infection, site not specified: Secondary | ICD-10-CM | POA: Diagnosis not present

## 2023-04-23 DIAGNOSIS — M9902 Segmental and somatic dysfunction of thoracic region: Secondary | ICD-10-CM | POA: Diagnosis not present

## 2023-04-23 DIAGNOSIS — M9903 Segmental and somatic dysfunction of lumbar region: Secondary | ICD-10-CM | POA: Diagnosis not present

## 2023-04-23 DIAGNOSIS — M47816 Spondylosis without myelopathy or radiculopathy, lumbar region: Secondary | ICD-10-CM | POA: Diagnosis not present

## 2023-04-23 DIAGNOSIS — M9904 Segmental and somatic dysfunction of sacral region: Secondary | ICD-10-CM | POA: Diagnosis not present

## 2023-04-23 DIAGNOSIS — M4724 Other spondylosis with radiculopathy, thoracic region: Secondary | ICD-10-CM | POA: Diagnosis not present

## 2023-04-23 DIAGNOSIS — M47817 Spondylosis without myelopathy or radiculopathy, lumbosacral region: Secondary | ICD-10-CM | POA: Diagnosis not present

## 2023-05-16 DIAGNOSIS — H402232 Chronic angle-closure glaucoma, bilateral, moderate stage: Secondary | ICD-10-CM | POA: Diagnosis not present

## 2023-05-28 DIAGNOSIS — M4724 Other spondylosis with radiculopathy, thoracic region: Secondary | ICD-10-CM | POA: Diagnosis not present

## 2023-05-28 DIAGNOSIS — E785 Hyperlipidemia, unspecified: Secondary | ICD-10-CM | POA: Diagnosis not present

## 2023-05-28 DIAGNOSIS — M47816 Spondylosis without myelopathy or radiculopathy, lumbar region: Secondary | ICD-10-CM | POA: Diagnosis not present

## 2023-05-28 DIAGNOSIS — M9904 Segmental and somatic dysfunction of sacral region: Secondary | ICD-10-CM | POA: Diagnosis not present

## 2023-05-28 DIAGNOSIS — M9902 Segmental and somatic dysfunction of thoracic region: Secondary | ICD-10-CM | POA: Diagnosis not present

## 2023-05-28 DIAGNOSIS — M9903 Segmental and somatic dysfunction of lumbar region: Secondary | ICD-10-CM | POA: Diagnosis not present

## 2023-05-28 DIAGNOSIS — E559 Vitamin D deficiency, unspecified: Secondary | ICD-10-CM | POA: Diagnosis not present

## 2023-05-28 DIAGNOSIS — M47817 Spondylosis without myelopathy or radiculopathy, lumbosacral region: Secondary | ICD-10-CM | POA: Diagnosis not present

## 2023-05-28 DIAGNOSIS — R7302 Impaired glucose tolerance (oral): Secondary | ICD-10-CM | POA: Diagnosis not present

## 2023-06-12 DIAGNOSIS — I110 Hypertensive heart disease with heart failure: Secondary | ICD-10-CM | POA: Diagnosis not present

## 2023-06-12 DIAGNOSIS — I5032 Chronic diastolic (congestive) heart failure: Secondary | ICD-10-CM | POA: Diagnosis not present

## 2023-06-12 DIAGNOSIS — Z0000 Encounter for general adult medical examination without abnormal findings: Secondary | ICD-10-CM | POA: Diagnosis not present

## 2023-06-12 DIAGNOSIS — E539 Vitamin B deficiency, unspecified: Secondary | ICD-10-CM | POA: Diagnosis not present

## 2023-06-12 DIAGNOSIS — E785 Hyperlipidemia, unspecified: Secondary | ICD-10-CM | POA: Diagnosis not present

## 2023-06-12 DIAGNOSIS — K582 Mixed irritable bowel syndrome: Secondary | ICD-10-CM | POA: Diagnosis not present

## 2023-06-12 DIAGNOSIS — G720 Drug-induced myopathy: Secondary | ICD-10-CM | POA: Diagnosis not present

## 2023-06-12 DIAGNOSIS — I700 Atherosclerosis of aorta: Secondary | ICD-10-CM | POA: Diagnosis not present

## 2023-06-12 DIAGNOSIS — M1991 Primary osteoarthritis, unspecified site: Secondary | ICD-10-CM | POA: Diagnosis not present

## 2023-06-12 DIAGNOSIS — F4321 Adjustment disorder with depressed mood: Secondary | ICD-10-CM | POA: Diagnosis not present

## 2023-06-12 DIAGNOSIS — I25119 Atherosclerotic heart disease of native coronary artery with unspecified angina pectoris: Secondary | ICD-10-CM | POA: Diagnosis not present

## 2023-06-12 DIAGNOSIS — G4700 Insomnia, unspecified: Secondary | ICD-10-CM | POA: Diagnosis not present

## 2023-07-02 DIAGNOSIS — M47816 Spondylosis without myelopathy or radiculopathy, lumbar region: Secondary | ICD-10-CM | POA: Diagnosis not present

## 2023-07-02 DIAGNOSIS — M9904 Segmental and somatic dysfunction of sacral region: Secondary | ICD-10-CM | POA: Diagnosis not present

## 2023-07-02 DIAGNOSIS — M4724 Other spondylosis with radiculopathy, thoracic region: Secondary | ICD-10-CM | POA: Diagnosis not present

## 2023-07-02 DIAGNOSIS — M9902 Segmental and somatic dysfunction of thoracic region: Secondary | ICD-10-CM | POA: Diagnosis not present

## 2023-07-02 DIAGNOSIS — M9903 Segmental and somatic dysfunction of lumbar region: Secondary | ICD-10-CM | POA: Diagnosis not present

## 2023-07-02 DIAGNOSIS — M47817 Spondylosis without myelopathy or radiculopathy, lumbosacral region: Secondary | ICD-10-CM | POA: Diagnosis not present

## 2023-08-06 DIAGNOSIS — M9902 Segmental and somatic dysfunction of thoracic region: Secondary | ICD-10-CM | POA: Diagnosis not present

## 2023-08-06 DIAGNOSIS — M47816 Spondylosis without myelopathy or radiculopathy, lumbar region: Secondary | ICD-10-CM | POA: Diagnosis not present

## 2023-08-06 DIAGNOSIS — M9903 Segmental and somatic dysfunction of lumbar region: Secondary | ICD-10-CM | POA: Diagnosis not present

## 2023-08-06 DIAGNOSIS — M47817 Spondylosis without myelopathy or radiculopathy, lumbosacral region: Secondary | ICD-10-CM | POA: Diagnosis not present

## 2023-08-06 DIAGNOSIS — M9904 Segmental and somatic dysfunction of sacral region: Secondary | ICD-10-CM | POA: Diagnosis not present

## 2023-08-06 DIAGNOSIS — M4724 Other spondylosis with radiculopathy, thoracic region: Secondary | ICD-10-CM | POA: Diagnosis not present

## 2023-09-04 DIAGNOSIS — M9903 Segmental and somatic dysfunction of lumbar region: Secondary | ICD-10-CM | POA: Diagnosis not present

## 2023-09-04 DIAGNOSIS — M5388 Other specified dorsopathies, sacral and sacrococcygeal region: Secondary | ICD-10-CM | POA: Diagnosis not present

## 2023-09-04 DIAGNOSIS — M9902 Segmental and somatic dysfunction of thoracic region: Secondary | ICD-10-CM | POA: Diagnosis not present

## 2023-09-04 DIAGNOSIS — M47814 Spondylosis without myelopathy or radiculopathy, thoracic region: Secondary | ICD-10-CM | POA: Diagnosis not present

## 2023-09-04 DIAGNOSIS — M4727 Other spondylosis with radiculopathy, lumbosacral region: Secondary | ICD-10-CM | POA: Diagnosis not present

## 2023-09-04 DIAGNOSIS — M9904 Segmental and somatic dysfunction of sacral region: Secondary | ICD-10-CM | POA: Diagnosis not present

## 2023-09-12 DIAGNOSIS — H402232 Chronic angle-closure glaucoma, bilateral, moderate stage: Secondary | ICD-10-CM | POA: Diagnosis not present

## 2023-09-17 DIAGNOSIS — M4727 Other spondylosis with radiculopathy, lumbosacral region: Secondary | ICD-10-CM | POA: Diagnosis not present

## 2023-09-17 DIAGNOSIS — M5388 Other specified dorsopathies, sacral and sacrococcygeal region: Secondary | ICD-10-CM | POA: Diagnosis not present

## 2023-09-17 DIAGNOSIS — M9903 Segmental and somatic dysfunction of lumbar region: Secondary | ICD-10-CM | POA: Diagnosis not present

## 2023-09-17 DIAGNOSIS — M47814 Spondylosis without myelopathy or radiculopathy, thoracic region: Secondary | ICD-10-CM | POA: Diagnosis not present

## 2023-09-17 DIAGNOSIS — M9902 Segmental and somatic dysfunction of thoracic region: Secondary | ICD-10-CM | POA: Diagnosis not present

## 2023-09-17 DIAGNOSIS — M9904 Segmental and somatic dysfunction of sacral region: Secondary | ICD-10-CM | POA: Diagnosis not present

## 2023-09-18 DIAGNOSIS — D649 Anemia, unspecified: Secondary | ICD-10-CM | POA: Diagnosis not present

## 2023-09-18 DIAGNOSIS — D519 Vitamin B12 deficiency anemia, unspecified: Secondary | ICD-10-CM | POA: Diagnosis not present

## 2023-09-18 DIAGNOSIS — Z79899 Other long term (current) drug therapy: Secondary | ICD-10-CM | POA: Diagnosis not present

## 2023-10-01 DIAGNOSIS — M9902 Segmental and somatic dysfunction of thoracic region: Secondary | ICD-10-CM | POA: Diagnosis not present

## 2023-10-01 DIAGNOSIS — M47817 Spondylosis without myelopathy or radiculopathy, lumbosacral region: Secondary | ICD-10-CM | POA: Diagnosis not present

## 2023-10-01 DIAGNOSIS — M9904 Segmental and somatic dysfunction of sacral region: Secondary | ICD-10-CM | POA: Diagnosis not present

## 2023-10-01 DIAGNOSIS — M9903 Segmental and somatic dysfunction of lumbar region: Secondary | ICD-10-CM | POA: Diagnosis not present

## 2023-10-01 DIAGNOSIS — M4725 Other spondylosis with radiculopathy, thoracolumbar region: Secondary | ICD-10-CM | POA: Diagnosis not present

## 2023-10-01 DIAGNOSIS — M5388 Other specified dorsopathies, sacral and sacrococcygeal region: Secondary | ICD-10-CM | POA: Diagnosis not present

## 2023-10-16 DIAGNOSIS — R339 Retention of urine, unspecified: Secondary | ICD-10-CM | POA: Diagnosis not present

## 2023-10-16 DIAGNOSIS — N390 Urinary tract infection, site not specified: Secondary | ICD-10-CM | POA: Diagnosis not present

## 2023-10-22 DIAGNOSIS — M5388 Other specified dorsopathies, sacral and sacrococcygeal region: Secondary | ICD-10-CM | POA: Diagnosis not present

## 2023-10-22 DIAGNOSIS — M47817 Spondylosis without myelopathy or radiculopathy, lumbosacral region: Secondary | ICD-10-CM | POA: Diagnosis not present

## 2023-10-22 DIAGNOSIS — M4725 Other spondylosis with radiculopathy, thoracolumbar region: Secondary | ICD-10-CM | POA: Diagnosis not present

## 2023-10-22 DIAGNOSIS — M9902 Segmental and somatic dysfunction of thoracic region: Secondary | ICD-10-CM | POA: Diagnosis not present

## 2023-10-22 DIAGNOSIS — M9903 Segmental and somatic dysfunction of lumbar region: Secondary | ICD-10-CM | POA: Diagnosis not present

## 2023-10-22 DIAGNOSIS — M9904 Segmental and somatic dysfunction of sacral region: Secondary | ICD-10-CM | POA: Diagnosis not present

## 2023-10-31 DIAGNOSIS — H402232 Chronic angle-closure glaucoma, bilateral, moderate stage: Secondary | ICD-10-CM | POA: Diagnosis not present

## 2023-11-08 DIAGNOSIS — M4727 Other spondylosis with radiculopathy, lumbosacral region: Secondary | ICD-10-CM | POA: Diagnosis not present

## 2023-11-08 DIAGNOSIS — M9902 Segmental and somatic dysfunction of thoracic region: Secondary | ICD-10-CM | POA: Diagnosis not present

## 2023-11-08 DIAGNOSIS — M47897 Other spondylosis, lumbosacral region: Secondary | ICD-10-CM | POA: Diagnosis not present

## 2023-11-08 DIAGNOSIS — M9903 Segmental and somatic dysfunction of lumbar region: Secondary | ICD-10-CM | POA: Diagnosis not present

## 2023-11-08 DIAGNOSIS — M47814 Spondylosis without myelopathy or radiculopathy, thoracic region: Secondary | ICD-10-CM | POA: Diagnosis not present

## 2023-11-08 DIAGNOSIS — M9904 Segmental and somatic dysfunction of sacral region: Secondary | ICD-10-CM | POA: Diagnosis not present

## 2023-11-19 DIAGNOSIS — M47897 Other spondylosis, lumbosacral region: Secondary | ICD-10-CM | POA: Diagnosis not present

## 2023-11-19 DIAGNOSIS — M4727 Other spondylosis with radiculopathy, lumbosacral region: Secondary | ICD-10-CM | POA: Diagnosis not present

## 2023-11-19 DIAGNOSIS — M47814 Spondylosis without myelopathy or radiculopathy, thoracic region: Secondary | ICD-10-CM | POA: Diagnosis not present

## 2023-11-19 DIAGNOSIS — S29012A Strain of muscle and tendon of back wall of thorax, initial encounter: Secondary | ICD-10-CM | POA: Diagnosis not present

## 2023-11-19 DIAGNOSIS — M9902 Segmental and somatic dysfunction of thoracic region: Secondary | ICD-10-CM | POA: Diagnosis not present

## 2023-11-19 DIAGNOSIS — Z6822 Body mass index (BMI) 22.0-22.9, adult: Secondary | ICD-10-CM | POA: Diagnosis not present

## 2023-11-19 DIAGNOSIS — M9903 Segmental and somatic dysfunction of lumbar region: Secondary | ICD-10-CM | POA: Diagnosis not present

## 2023-11-19 DIAGNOSIS — M9904 Segmental and somatic dysfunction of sacral region: Secondary | ICD-10-CM | POA: Diagnosis not present

## 2023-12-10 DIAGNOSIS — M5388 Other specified dorsopathies, sacral and sacrococcygeal region: Secondary | ICD-10-CM | POA: Diagnosis not present

## 2023-12-10 DIAGNOSIS — M4727 Other spondylosis with radiculopathy, lumbosacral region: Secondary | ICD-10-CM | POA: Diagnosis not present

## 2023-12-10 DIAGNOSIS — M9902 Segmental and somatic dysfunction of thoracic region: Secondary | ICD-10-CM | POA: Diagnosis not present

## 2023-12-10 DIAGNOSIS — M4725 Other spondylosis with radiculopathy, thoracolumbar region: Secondary | ICD-10-CM | POA: Diagnosis not present

## 2023-12-10 DIAGNOSIS — M9904 Segmental and somatic dysfunction of sacral region: Secondary | ICD-10-CM | POA: Diagnosis not present

## 2023-12-10 DIAGNOSIS — M9903 Segmental and somatic dysfunction of lumbar region: Secondary | ICD-10-CM | POA: Diagnosis not present

## 2024-01-02 DIAGNOSIS — H353131 Nonexudative age-related macular degeneration, bilateral, early dry stage: Secondary | ICD-10-CM | POA: Diagnosis not present

## 2024-01-02 DIAGNOSIS — H04129 Dry eye syndrome of unspecified lacrimal gland: Secondary | ICD-10-CM | POA: Diagnosis not present

## 2024-01-02 DIAGNOSIS — H402232 Chronic angle-closure glaucoma, bilateral, moderate stage: Secondary | ICD-10-CM | POA: Diagnosis not present

## 2024-01-07 DIAGNOSIS — M4728 Other spondylosis with radiculopathy, sacral and sacrococcygeal region: Secondary | ICD-10-CM | POA: Diagnosis not present

## 2024-01-07 DIAGNOSIS — M9902 Segmental and somatic dysfunction of thoracic region: Secondary | ICD-10-CM | POA: Diagnosis not present

## 2024-01-07 DIAGNOSIS — M47894 Other spondylosis, thoracic region: Secondary | ICD-10-CM | POA: Diagnosis not present

## 2024-01-07 DIAGNOSIS — M4727 Other spondylosis with radiculopathy, lumbosacral region: Secondary | ICD-10-CM | POA: Diagnosis not present

## 2024-01-07 DIAGNOSIS — M9903 Segmental and somatic dysfunction of lumbar region: Secondary | ICD-10-CM | POA: Diagnosis not present

## 2024-01-07 DIAGNOSIS — M25551 Pain in right hip: Secondary | ICD-10-CM | POA: Diagnosis not present

## 2024-01-07 DIAGNOSIS — M9904 Segmental and somatic dysfunction of sacral region: Secondary | ICD-10-CM | POA: Diagnosis not present

## 2024-01-21 DIAGNOSIS — K2960 Other gastritis without bleeding: Secondary | ICD-10-CM | POA: Diagnosis not present

## 2024-01-21 DIAGNOSIS — Z6821 Body mass index (BMI) 21.0-21.9, adult: Secondary | ICD-10-CM | POA: Diagnosis not present

## 2024-01-21 DIAGNOSIS — M25551 Pain in right hip: Secondary | ICD-10-CM | POA: Diagnosis not present

## 2024-01-21 DIAGNOSIS — I110 Hypertensive heart disease with heart failure: Secondary | ICD-10-CM | POA: Diagnosis not present

## 2024-01-21 DIAGNOSIS — R0789 Other chest pain: Secondary | ICD-10-CM | POA: Diagnosis not present

## 2024-01-21 DIAGNOSIS — I5032 Chronic diastolic (congestive) heart failure: Secondary | ICD-10-CM | POA: Diagnosis not present

## 2024-01-28 DIAGNOSIS — M9902 Segmental and somatic dysfunction of thoracic region: Secondary | ICD-10-CM | POA: Diagnosis not present

## 2024-01-28 DIAGNOSIS — M47894 Other spondylosis, thoracic region: Secondary | ICD-10-CM | POA: Diagnosis not present

## 2024-01-28 DIAGNOSIS — M9903 Segmental and somatic dysfunction of lumbar region: Secondary | ICD-10-CM | POA: Diagnosis not present

## 2024-01-28 DIAGNOSIS — M4728 Other spondylosis with radiculopathy, sacral and sacrococcygeal region: Secondary | ICD-10-CM | POA: Diagnosis not present

## 2024-01-28 DIAGNOSIS — M9904 Segmental and somatic dysfunction of sacral region: Secondary | ICD-10-CM | POA: Diagnosis not present

## 2024-01-28 DIAGNOSIS — M4727 Other spondylosis with radiculopathy, lumbosacral region: Secondary | ICD-10-CM | POA: Diagnosis not present

## 2024-01-28 DIAGNOSIS — M25551 Pain in right hip: Secondary | ICD-10-CM | POA: Diagnosis not present

## 2024-02-25 DIAGNOSIS — M546 Pain in thoracic spine: Secondary | ICD-10-CM | POA: Diagnosis not present

## 2024-02-25 DIAGNOSIS — M5388 Other specified dorsopathies, sacral and sacrococcygeal region: Secondary | ICD-10-CM | POA: Diagnosis not present

## 2024-02-25 DIAGNOSIS — M4727 Other spondylosis with radiculopathy, lumbosacral region: Secondary | ICD-10-CM | POA: Diagnosis not present

## 2024-02-25 DIAGNOSIS — M9904 Segmental and somatic dysfunction of sacral region: Secondary | ICD-10-CM | POA: Diagnosis not present

## 2024-02-25 DIAGNOSIS — M9903 Segmental and somatic dysfunction of lumbar region: Secondary | ICD-10-CM | POA: Diagnosis not present

## 2024-02-25 DIAGNOSIS — M9902 Segmental and somatic dysfunction of thoracic region: Secondary | ICD-10-CM | POA: Diagnosis not present

## 2024-03-07 DIAGNOSIS — H04129 Dry eye syndrome of unspecified lacrimal gland: Secondary | ICD-10-CM | POA: Diagnosis not present

## 2024-03-07 DIAGNOSIS — H402232 Chronic angle-closure glaucoma, bilateral, moderate stage: Secondary | ICD-10-CM | POA: Diagnosis not present

## 2024-03-24 DIAGNOSIS — M5416 Radiculopathy, lumbar region: Secondary | ICD-10-CM | POA: Diagnosis not present

## 2024-03-24 DIAGNOSIS — M9902 Segmental and somatic dysfunction of thoracic region: Secondary | ICD-10-CM | POA: Diagnosis not present

## 2024-03-24 DIAGNOSIS — M5417 Radiculopathy, lumbosacral region: Secondary | ICD-10-CM | POA: Diagnosis not present

## 2024-03-24 DIAGNOSIS — M9903 Segmental and somatic dysfunction of lumbar region: Secondary | ICD-10-CM | POA: Diagnosis not present

## 2024-03-24 DIAGNOSIS — M9904 Segmental and somatic dysfunction of sacral region: Secondary | ICD-10-CM | POA: Diagnosis not present

## 2024-03-24 DIAGNOSIS — M546 Pain in thoracic spine: Secondary | ICD-10-CM | POA: Diagnosis not present

## 2024-04-01 DIAGNOSIS — H401133 Primary open-angle glaucoma, bilateral, severe stage: Secondary | ICD-10-CM | POA: Diagnosis not present

## 2024-04-11 DIAGNOSIS — H402232 Chronic angle-closure glaucoma, bilateral, moderate stage: Secondary | ICD-10-CM | POA: Diagnosis not present

## 2024-04-11 DIAGNOSIS — H401123 Primary open-angle glaucoma, left eye, severe stage: Secondary | ICD-10-CM | POA: Diagnosis not present

## 2024-04-15 DIAGNOSIS — Z1231 Encounter for screening mammogram for malignant neoplasm of breast: Secondary | ICD-10-CM | POA: Diagnosis not present

## 2024-04-21 DIAGNOSIS — M9902 Segmental and somatic dysfunction of thoracic region: Secondary | ICD-10-CM | POA: Diagnosis not present

## 2024-04-21 DIAGNOSIS — M9901 Segmental and somatic dysfunction of cervical region: Secondary | ICD-10-CM | POA: Diagnosis not present

## 2024-04-21 DIAGNOSIS — M47812 Spondylosis without myelopathy or radiculopathy, cervical region: Secondary | ICD-10-CM | POA: Diagnosis not present

## 2024-04-21 DIAGNOSIS — M9904 Segmental and somatic dysfunction of sacral region: Secondary | ICD-10-CM | POA: Diagnosis not present

## 2024-04-21 DIAGNOSIS — M5388 Other specified dorsopathies, sacral and sacrococcygeal region: Secondary | ICD-10-CM | POA: Diagnosis not present

## 2024-04-21 DIAGNOSIS — M546 Pain in thoracic spine: Secondary | ICD-10-CM | POA: Diagnosis not present
